# Patient Record
Sex: Female | Born: 1950 | Race: White | Hispanic: No | Marital: Married | Smoking: Never smoker
Health system: Southern US, Community
[De-identification: ages and names within clinical notes are randomized; demographics above are authoritative.]

## PROBLEM LIST (undated history)

## (undated) DIAGNOSIS — T4145XA Adverse effect of unspecified anesthetic, initial encounter: Secondary | ICD-10-CM

## (undated) DIAGNOSIS — M1990 Unspecified osteoarthritis, unspecified site: Secondary | ICD-10-CM

## (undated) DIAGNOSIS — E785 Hyperlipidemia, unspecified: Secondary | ICD-10-CM

## (undated) DIAGNOSIS — I10 Essential (primary) hypertension: Secondary | ICD-10-CM

## (undated) DIAGNOSIS — T8859XA Other complications of anesthesia, initial encounter: Secondary | ICD-10-CM

## (undated) HISTORY — DX: Hyperlipidemia, unspecified: E78.5

## (undated) HISTORY — DX: Unspecified osteoarthritis, unspecified site: M19.90

## (undated) HISTORY — PX: TOTAL KNEE ARTHROPLASTY: SHX125

## (undated) HISTORY — PX: HERNIA REPAIR: SHX51

## (undated) HISTORY — PX: OTHER SURGICAL HISTORY: SHX169

## (undated) HISTORY — DX: Essential (primary) hypertension: I10

---

## 2004-12-25 ENCOUNTER — Encounter: Admission: RE | Admit: 2004-12-25 | Discharge: 2004-12-25 | Payer: Self-pay | Admitting: Orthopedic Surgery

## 2005-06-22 ENCOUNTER — Encounter: Admission: RE | Admit: 2005-06-22 | Discharge: 2005-06-22 | Payer: Self-pay | Admitting: Family Medicine

## 2005-06-29 ENCOUNTER — Encounter: Admission: RE | Admit: 2005-06-29 | Discharge: 2005-06-29 | Payer: Self-pay | Admitting: Family Medicine

## 2007-01-26 ENCOUNTER — Inpatient Hospital Stay (HOSPITAL_COMMUNITY): Admission: RE | Admit: 2007-01-26 | Discharge: 2007-01-31 | Payer: Self-pay | Admitting: Orthopedic Surgery

## 2007-10-17 ENCOUNTER — Inpatient Hospital Stay (HOSPITAL_COMMUNITY): Admission: RE | Admit: 2007-10-17 | Discharge: 2007-10-24 | Payer: Self-pay | Admitting: Orthopedic Surgery

## 2007-11-15 ENCOUNTER — Inpatient Hospital Stay (HOSPITAL_COMMUNITY): Admission: AD | Admit: 2007-11-15 | Discharge: 2007-11-17 | Payer: Self-pay | Admitting: Orthopedic Surgery

## 2007-12-07 ENCOUNTER — Encounter (INDEPENDENT_AMBULATORY_CARE_PROVIDER_SITE_OTHER): Payer: Self-pay | Admitting: Orthopedic Surgery

## 2007-12-07 ENCOUNTER — Ambulatory Visit: Payer: Self-pay | Admitting: Vascular Surgery

## 2007-12-07 ENCOUNTER — Ambulatory Visit (HOSPITAL_COMMUNITY): Admission: RE | Admit: 2007-12-07 | Discharge: 2007-12-07 | Payer: Self-pay | Admitting: Orthopedic Surgery

## 2007-12-30 ENCOUNTER — Inpatient Hospital Stay (HOSPITAL_COMMUNITY): Admission: RE | Admit: 2007-12-30 | Discharge: 2008-01-01 | Payer: Self-pay | Admitting: Orthopedic Surgery

## 2007-12-30 ENCOUNTER — Encounter (INDEPENDENT_AMBULATORY_CARE_PROVIDER_SITE_OTHER): Payer: Self-pay | Admitting: Orthopedic Surgery

## 2008-10-06 ENCOUNTER — Emergency Department (HOSPITAL_COMMUNITY): Admission: EM | Admit: 2008-10-06 | Discharge: 2008-10-07 | Payer: Self-pay | Admitting: Emergency Medicine

## 2009-01-10 ENCOUNTER — Encounter: Admission: RE | Admit: 2009-01-10 | Discharge: 2009-01-10 | Payer: Self-pay | Admitting: Family Medicine

## 2010-02-19 ENCOUNTER — Encounter: Admission: RE | Admit: 2010-02-19 | Discharge: 2010-02-19 | Payer: Self-pay | Admitting: General Surgery

## 2010-11-25 NOTE — Discharge Summary (Signed)
[name], [name]               ACCOUNT NO.:  [id number]   MEDICAL RECORD NO.:  [id number]          PATIENT TYPE:  INP   LOCATION:  1610                         FACILITY:  [facility]   PHYSICIAN:  Almedia Balls. Ranell Patrick, M.D. DATE OF BIRTH:  [birth year]   DATE OF ADMISSION:  01/26/2007  DATE OF DISCHARGE:  01/31/2007                               DISCHARGE SUMMARY   ADMISSION DIAGNOSIS:  Right knee pain secondary to osteoarthritis.   DISCHARGE DIAGNOSIS:  Right knee, status post right total knee  arthroplasty.   BRIEF HISTORY:  The patient is a 60 year old female who has been  complaining of worsening right knee pain that has been refractory to  conservative treatment such as cortisone injections and also physical  therapy.  The patient has elected have a right total knee arthroplasty  by Dr. Malon Kindle.   PROCEDURE:  The patient had a right total knee arthroplasty by Dr.  Malon Kindle on January 26, 2007, noted no complications.  Assistant was  [employer], PA-C.  Spinal anesthesia was used.   HOSPITAL COURSE:  The patient admitted on January 26, 2007, for the above-  stated procedure, which she tolerated well after adequate time in the  post anesthesia care unit, she was transferred up to 6 East.  The  patient was complaining about moderate pain in that right knee on postop  days #1 and 2.  She was able to complete some mild physical therapy and  was only getting up to a chair but states she states she did start  walking on postop day #3 and she is walking quite well by herself with a  walker.  There was a recommendation by occupational therapy and physical  therapy for a possible rehab stint before discharge home but the patient  was very adamant about going home with her daughter's help, and thus we  did give her an extra day over the weekend to work with physical therapy  so she could be strong enough to be discharged home to avoid a skilled  nursing facility stay.  The patient is  being discharged home on January 31, 2007.   The patient has allergies to PENICILLIN.   DISCHARGE MEDICATIONS:  1. Cipro 500 mg one tablet b.i.d. x7 days.  2. Ferrous sulfate 325 mg p.o. daily.  3. Hydrochlorothiazide 25 mg p.o. daily.  4. Lisinopril 20 mg p.o. daily.  5. Robaxin 500 mg p.o. q.6h.  6. Lopressor 100 mg p.o. daily.  7. Coumadin.  8. Percocet 5/325 mg one to two tablets q.4-6h. p.r.n. pain.  9. Restoril 15 mg p.o. q.h.s. p.r.n.   FOLLOW-UP:  The patient will follow back up with Dr. Malon Kindle.  Her  diet is regular.   CONDITION:  Stable.      Thomas B. Durwin Nora, P.A.      Almedia Balls. Ranell Patrick, M.D.  Electronically Signed    TBD/MEDQ  D:  01/31/2007  T:  01/31/2007  Job:  [id number]

## 2010-11-25 NOTE — H&P (Signed)
[name], [name]               ACCOUNT NO.:  [id number]   MEDICAL RECORD NO.:  [id number]          PATIENT TYPE:  INP   LOCATION:  NA                           FACILITY:  [facility]   PHYSICIAN:  Almedia Balls. Ranell Patrick, M.D. DATE OF BIRTH:  1951-02-06   DATE OF ADMISSION:  DATE OF DISCHARGE:                              HISTORY & PHYSICAL   [name] is a 60 year old female with chief complaint of left knee  pain.   HISTORY OF PRESENT ILLNESS:  The patient has been having worsening left  knee pain for over a year that has been refractory to conservative  treatment.  Patient has elected to have a left total knee arthroplasty  by Dr. Malon Kindle.   PAST MEDICAL HISTORY:  Anxiety and hypertension.   FAMILY MEDICAL HISTORY:  Is significant for hypertension, coronary  artery disease.   SOCIAL HISTORY:  The patient does not smoke or use alcohol.  The patient  has a primary medical doctor but did not list it.   DRUG ALLERGIES:  PENICILLIN.   CURRENT MEDICATIONS:  1. Lisinopril/hydrochlorothiazide 20/25 mg 1 tablet p.o. daily.  2. Lasix 20 mg p.o. daily.  3. Metoprolol 100 mg p.o. daily.  4. Alprazolam 0.25 mg p.r.n. t.i.d.  5. Multivitamin daily.  6. Vicodin 5/500 one to two tablets q.4-6h. pain.  7. Calcium 600 mg p.o. daily.   REVIEW OF SYSTEMS:  Negative except for pain with ambulation.   PHYSICAL EXAMINATION:  Pulse 80, respirations 18, blood pressure 118/73.  The patient is a healthy-appearing, 60 year old female in no acute  distress.  Pleasant in affect, oriented x3.  NECK:  Shows full range of motion without any tenderness.  No signs of  spasm.  Cranial nerves II-XII grossly intact.  CHEST:  Active breath sounds bilaterally.  No wheezes, rhonchi, or  rales.  HEART:  Shows regular rate and rhythm.  No murmur.  ABDOMEN:  Nontender, nondistended with active bowel sounds.  EXTREMITIES:  Showed moderate tenderness of the left knee especially to  the medial joint line with  range of motion and crepitus.  SKIN:  Shows no edema.  No rashes.   X-ray showed left knee end-stage osteoarthritis.   IMPRESSION:  Left knee end-stage osteoarthritis.   PLAN OF ACTION:  Is to have a left total knee arthroplasty by Dr. Malon Kindle.      Thomas B. Durwin Nora, P.A.      Almedia Balls. Ranell Patrick, M.D.  Electronically Signed    TBD/MEDQ  D:  10/05/2007  T:  10/06/2007  Job:  [id number]

## 2010-11-25 NOTE — Op Note (Signed)
[name], [name]               ACCOUNT NO.:  [id number]   MEDICAL RECORD NO.:  [id number]          PATIENT TYPE:  AMB   LOCATION:  DAY                          FACILITY:  [facility]   PHYSICIAN:  Almedia Balls. Ranell Patrick, M.D. DATE OF BIRTH:  1950-10-30   DATE OF PROCEDURE:  12/30/2007  DATE OF DISCHARGE:                               OPERATIVE REPORT   PREOPERATIVE DIAGNOSES:  Left leg infection, status post total knee  replacement.   POSTOPERATIVE DIAGNOSES:  Left leg infection, status post total knee  replacement.   PROCEDURE INFORMED:  Left leg irrigation and debridement.   SURGEON:  Almedia Balls. Ranell Patrick, M.D.   ASSISTANT:  None.   ANESTHESIA:  LMA general anesthesia was used.   ESTIMATED BLOOD LOSS:  Minimal.   FLUID REPLACEMENT:  1000 mL of crystalloid.   COUNTS:  Correct.   COMPLICATIONS:  None.   Preoperative antibiotics were given post culture.   INDICATIONS:  The patient is a 56-year female, status post total knee  replacement.  She is now approximately 3 months out from her knee  replacement and has an area at the very distal aspect incision which is  erythematous and swollen.  She has had a prior I&D of the area and  treatment with 4 weeks of IV vancomycin.  The concern is for persistent  soft tissue infection.  The patient does not have clinical signs or  symptoms consistent with an infected total knee replacement.  The  patient presents now for operative I&D.  Informed consent was obtained.   DESCRIPTION OF PROCEDURE:  After an adequate level of anesthesia was  achieved, the patient was positioned supine on the operating room table.  The left leg was sterilely prepped and draped in the usual manner.  The  patient's distal incision was opened up using a 15 blade scalpel.  Dissection was carried sharply down through the subcutaneous tissues.  There was no area of obvious purulence.  There was scar tissue present.  There was some area of softened subcutaneous tissue  which was removed.  The remaining tissue appeared viable and intact.  We went  ahead and pulse irrigated with 3 liters of irrigation.  We then packed  the wound open with a very dilute Betadine solution and some 4x4s and  then a sterile compressive bandage was applied.  The patient had Cubicin  started IV intraoperatively, and we will be following her cultures  closely.      Almedia Balls. Ranell Patrick, M.D.  Electronically Signed     SRN/MEDQ  D:  12/30/2007  T:  12/30/2007  Job:  [id number]

## 2010-11-25 NOTE — H&P (Signed)
[name], [name] NO.:  [id number]   MEDICAL RECORD NO.:  [id number]        PATIENT TYPE:  LINP   LOCATION:                               FACILITY:  [facility]   PHYSICIAN:  Almedia Balls. Ranell Patrick, M.D. DATE OF BIRTH:  [birth year]   DATE OF ADMISSION:  01/26/2007  DATE OF DISCHARGE:                              HISTORY & PHYSICAL   CHIEF COMPLAINT:  Right knee pain.   HISTORY OF PRESENT ILLNESS:  The patient has been having increasing  right knee pain for the past year that has been refractory to  conservative treatment.  The patient would like to have a right total  knee arthroplasty by Dr. Malon Kindle.   PAST MEDICAL HISTORY:  Hypertension.   FAMILY MEDICAL HISTORY:  Cancer and hypertension.   SOCIAL HISTORY:  The patient is divorced.  A patient of Dr. Clovis Riley.  Does not smoke or use alcohol.   ALLERGIES:  PENICILLIN.   CURRENT MEDICATIONS:  1. Metoprolol 100 mg p.o. daily.  2. Lisinopril/hydrochlorothiazide 10/12.5  3. Motrin 800 mg p.o. t.i.d.  4. Calcium 600 mg p.o. daily.   REVIEW OF SYSTEMS:  Negative except for painful ambulation.   PHYSICAL EXAMINATION:  VITAL SIGNS:  Pulse 80, respirations 18, blood  pressure 160/90.  GENERAL:  This is a healthy-appearing 60 year old female in no acute  distress.  Pleasant mood and affect.  Alert and oriented  x3.  NECK:  Shows full range of motion without any tenderness.  CHEST:  Active breath sounds bilaterally with no wheezes, rhonchi, or  rales.  HEART:  Shows regular rate and rhythm.  No murmur.  ABDOMEN:  Nontender, nondistended.  EXTREMITIES:  Show moderate tenderness to the right knee, especially  with range of motion and medial joint line tenderness.  SKIN:  Shows no edema, no rashes.  NEUROLOGIC:  She is intact.   X-ray shows severe osteoarthritis, right knee.   IMPRESSION:  Right knee osteoarthritis, severe.   PLAN:  Right total knee arthroplasty by Dr. Malon Kindle.      Thomas B. Durwin Nora,  P.A.      Almedia Balls. Ranell Patrick, M.D.  Electronically Signed    TBD/MEDQ  D:  01/13/2007  T:  01/13/2007  Job:  [id number]

## 2010-11-25 NOTE — Op Note (Signed)
[name], [name]   MEDICAL RECORD NO.:  [id number]          PATIENT TYPE:  INP   LOCATION:  0011                         FACILITY:  [facility]   PHYSICIAN:  [name], M.D. DATE OF BIRTH:  1950/08/01   DATE OF PROCEDURE:  10/17/2007  DATE OF DISCHARGE:                               OPERATIVE REPORT   PREOPERATIVE DIAGNOSES:  Left knee end-stage osteoarthritis.   POSTOPERATIVE DIAGNOSES:  Left knee end-stage osteoarthritis.   PROCEDURE PERFORMED:  Left total knee replacement using DePuy sigma  rotating platform prosthesis.   SURGEON:  Beverely Low, MD.   ASSISTANT:  Konrad Felix [place], [place].   ANESTHESIA:  General anesthesia was used plus femoral block.   ESTIMATED BLOOD LOSS:  Minimal.   TOURNIQUET TIME:  1 hour and 30 minutes at 350 mmHg.   FLUID REPLACEMENT:  1500 mL crystalloid.   URINE OUTPUT:  200  mL.   INDICATIONS:  The patient is a 60 year old female with a history of  worsening left knee pain.  The patient has known end-stage  osteoarthritis and is status post right total knee replacement and has  done well with that. She presents now with disabling pain and poor  function, desiring total knee replacement to restore function and  eliminate pain.  Informed consent was obtained.   DESCRIPTION OF PROCEDURE:  After an adequate level of anesthesia was  achieved, the patient positioned supine on the operating room table,  left leg was correctly identified, a nonsterile tourniquet was placed on  her left proximal thigh, left leg sterilely prepped and draped in the  usual manner. The patient had no significant flexion contracture on the  table.  After sterile prep and drape and exsanguination of the limb  using Esmarch bandage, we elevated the tourniquet to 350 mmHg. A  longitudinal midline incision was created with the knee in flexion using  a 10 blade scalpel.  Dissection was carried sharply down through  subcutaneous  tissues using a scalpel. A  fresh blade was used to perform  the medial parapatellar arthrotomy.  We everted the patella, divided the  patellofemoral ligaments on the lateral side further gaining exposure.  We then entered the distal femur using a step-cut drill, placing  intramedullary distal femoral resection guide set on 5 degrees left with  10 mm of resection after we resected the distal femur. We sized it to a  size three, performed our anterior, posterior and chamfer cuts with a 4  in 1 block. We then identified the ACL and  PCL, removed all meniscal  tissue, subluxed the tibia anteriorly and performed a tibial cut 6 mm  off the affected side perpendicular to the long axis of the tibia with  minimal posterior slope. We then went ahead and removed all excess  posterior bone off the posterior femur. We then checked our flexion  extension gaps which were balanced and symmetric both in extension and  flexion. We then completed our resection on the tibia and the femur with  a box cut guide on the femur using the tibial preparation instruments to  perform our keel punch. We then placed the trial components and placed a  15 insert in which gave [place] excellent flexion, extension and stability in  full extension with no significant lift off with knee flexion. We then  surfaced the patella with a 35 patella starting at 25 mm thickness going  down to 16 mm thickness and doing this free hand. Once we drilled our  lugs and placed our patella button on, we took the knee through a full  range of motion. Excellent balance was noted with no hand technique. We  then removed all our trial components, plugged the end of the femur with  available bone and thoroughly pulse irrigated and cemented the  components into place. Once the cement was allowed to harden, we removed  excess cement using a 1/4 inch curved osteotome. Trialed both 15 and  17.5 and 17.5 did allow for full extension and gave better balance  and  flexion and better stability and flexion with no hyperextension. We then  took our real 17.5 insert and inserted that into place, placed the drain  deep to the joint and then closed the parapatellar arthrotomy with #1  PDS suture in an interrupted figure-of-eight fashion followed by #0  Vicryl and then 2-0 Vicryl for the subcutaneous closure and 4-0 Monocryl  for skin. Steri-Strips applied followed by a sterile dressing. The  patient tolerated the surgery well.      [name], M.D.  Electronically Signed     SRN/MEDQ  D:  10/17/2007  T:  10/17/2007  Job:  [id number]

## 2010-11-25 NOTE — Op Note (Signed)
NAMEMYKAEL, TROTT               ACCOUNT NO.:  [id number]   MEDICAL RECORD NO.:  [id number]          PATIENT TYPE:  INP   LOCATION:  X001                         FACILITY:  [facility]   PHYSICIAN:  Almedia Balls. Ranell Patrick, M.D. DATE OF BIRTH:  09/09/1950   DATE OF PROCEDURE:  01/26/2007  DATE OF DISCHARGE:                               OPERATIVE REPORT   PREOPERATIVE DIAGNOSIS:  Right knee pain secondary to end-stage  osteoarthritis.   POSTOPERATIVE DIAGNOSIS:  Right knee pain secondary to end-stage  osteoarthritis.   PROCEDURE PERFORMED:  Right total knee replacement using DePuy segmented  rotating platform prosthesis.   ATTENDING SURGEON:  Almedia Balls. Ranell Patrick, M.D.   ASSISTANT:  [place] Charon, [place].   ANESTHESIA:  General anesthesia was used.   ESTIMATED BLOOD LOSS:  Minimal.   TOURNIQUET TIME:  One hour 40 minutes at 350 mmHg.   FLUID REPLACEMENT:  1800 mL of crystalloid.   URINE OUT:  250 mL.   COUNTS:  Instrument count was correct.   COMPLICATIONS:  No complications.   PREOPERATIVE ANTIBIOTICS:  Given.   INDICATIONS:  The patient is a 60 year old female with end-stage  osteoarthritis of the right knee.  The patient has disabling pain and  has failed multiple conservative remedies consisting of activity  modifications, anti-inflammatories and injections.  She has had  arthroscopic debridement as well.  The patient presents now for a total  knee arthroplasty to restore function and eliminate pain.  Informed  consent was obtained.   DESCRIPTION OF PROCEDURE:  After an adequate level of anesthesia was  achieved, she was positioned supine on the operating room table.  A  nonsterile tourniquet placed on the right proximal thigh.  The right leg  was sterilely prepped and draped in the usual manner.  A time-out was  performed, correctly identifying the right leg.  After exsanguinating  the limb using an Esmarch bandage, we elevated the tourniquet to 350  mmHg.  A longitudinal  skin incision was created with the knee in flexion  and dissection carried down through the parapatellar tissues.  We  performed a medial parapatellar arthrotomy, everting the patella,  flexing the knee, entering the distal femur with a step-cut drill  followed by insertion of a distal femoral cutting guide, which was  intramedullary.  We resected 10 mm of distal femur with a 5-degree right  setting.  We then performed our tibial cut, which was done perpendicular  to the long axis of the tibia with a 0-degree posterior slope.  We  resected 6 mm off the affected side.  We then went back to the femur and  we sized the femur to a size 3 and then placed our 4-in-1 cutting block  on the femur; this was a perfect fit.  We then performed anterior,  posterior and chamfer cuts.  Next, we went ahead and completed our  distal femoral resection with the box cut for the size 3 implant for  this posterior cruciate-substituting prosthesis.  I again subluxed the  tibia anteriorly, sized the tibia to a size 3 and then did our keel  punch for the tibia.  I placed our trial components in with a 12.5  insert, noted there to be excellent full extension and nice soft tissue  balance.  We felt that we could probably place a 15 insert and went  ahead and resurfaced the patella with a freehand technique to a 38  patella, starting thickness 26, down to 16 thickness and then we  replaced 9.  At this point, we removed all trial components, removed  excess bone off the posterior femoral condyles and also completed the  soft tissue debridement.  We then fully irrigated the knee with pulse  irrigation, dried the knee and then using DePuy SmartSet high-viscosity  cement, we cemented the components into place; this was a DePuy  segmental rotating platform prosthesis, size 3 tibia, size 3 right femur  with a 38 patella.  Once the cement was allowed to harden, we went ahead  retrialed with a 15; this was noted be excellent  for full extension,  soft tissue balance and range of motion.  We replaced the real 15 insert  in the knee, checked for any excess cement and then closed the knee with  a layered closure.  The parapatellar arthrotomy was closed with  interrupted #1 PDS suture and that was in a figure-of-eight fashion,  followed by 2-0 Vicryl subcutaneous closure and 4-0 Monocryl for the  skin.  Steri-Strips were applied followed by a sterile dressing,  compressive bandage and knee immobilizer.  The patient tolerated surgery  well.      Almedia Balls. Ranell Patrick, M.D.  Electronically Signed     SRN/MEDQ  D:  01/26/2007  T:  01/27/2007  Job:  [id number]

## 2010-11-25 NOTE — Discharge Summary (Signed)
[name], [name] NO.:  [id number]   MEDICAL RECORD NO.:  [id number]          PATIENT TYPE:  INP   LOCATION:  1608                         FACILITY:  [facility]   PHYSICIAN:  Almedia Balls. Ranell Patrick, M.D. DATE OF BIRTH:  04-Oct-1950   DATE OF ADMISSION:  10/17/2007  DATE OF DISCHARGE:                               DISCHARGE SUMMARY   ADMISSION DIAGNOSIS:  Left end-stage knee osteoarthritis.   DISCHARGE DIAGNOSIS:  Left end-stage knee osteoarthritis.   BRIEF HISTORY:  The patient is a 60 year old female with worsening left  knee pain.  The patient has elected to have a left total knee  arthroplasty by Dr. Malon Kindle, when symptoms were refractory to  conservative treatment.   PROCEDURE:  The patient had a left total knee arthroplasty by Dr. Malon Kindle on October 17, 2007.   ASSISTANT:  Standley Dakins PA-C.   ANESTHESIA:  General anesthesia with a femoral block were used.   ESTIMATED BLOOD LOSS:  Minimal.  No complications.   HOSPITAL COURSE:  The patient was admitted on October 17, 2007 for the  above-stated procedure, which she tolerated well.  After adequate time  in the post-anesthesia care unit, she was transferred up to 6 East.  The  patient on post-op day #1, was complaining of moderate pain to that left  knee, was unable to work with physical therapy very much, due to  secondary to her pain.  She had her drain pulled, dressing was kept  intact.  Post-op day #2 she did have a dressing change, was working with  a CPM and did progress slowly with physical therapy, did recommend  skilled nursing facility placement.  We are just awaiting placement for  it.  She did continue to work with physical therapy over the weekend.  On post-op days 3, 4, 5, labs were with acceptable limits.  No signs of  cellulitis or bloody drainage from the wound, and it was healing well.  Neurovascularly, she was intact distally.   DISCHARGE/PLAN:  The patient discharged to a short-term  skilled nursing  facility on October 24, 2007.   FOLLOWUP:  The patient to follow back up with Dr. Malon Kindle in 2  weeks.  Her condition is stable.   DIET:  Is low sodium.   DISCHARGE MEDICATIONS:  1. Lasix 20 mg p.o. q.h.s.  2. Hydrochlorothiazide 25 mg p.o. daily.  3. Lisinopril 20 mg p.o. daily.  4. Robaxin 500 mph q.6 h.  5. Lopressor 100 mg p.o. daily.  6. Coumadin per pharmacy protocol.  7. Xanax 0.25 mg p.o. t.i.d. p.r.n.  8. Percocet 5/3.5, 1-2 tablets q.4-6 hours p.r.n. pain.   ALLERGIES:  The patient has an allergy to penicillin.   ACTIVITY LEVEL:  Weightbearing as tolerated.  Will see the patient back  in 2 weeks.   INR at time of discharge is 1.9.      Thomas B. Durwin Nora, P.A.      Almedia Balls. Ranell Patrick, M.D.  Electronically Signed    TBD/MEDQ  D:  10/23/2007  T:  10/23/2007  Job:  [id number]

## 2010-11-25 NOTE — Op Note (Signed)
[name], [name]               ACCOUNT NO.:  [id number]   MEDICAL RECORD NO.:  [id number]          PATIENT TYPE:  INP   LOCATION:  1617                         FACILITY:  [facility]   PHYSICIAN:  Almedia Balls. Ranell Patrick, M.D. DATE OF BIRTH:  [birth year]   DATE OF PROCEDURE:  11/15/2007  DATE OF DISCHARGE:                               OPERATIVE REPORT   PREOPERATIVE DIAGNOSIS:  Left knee infection.   POSTOPERATIVE DIAGNOSIS:  Left knee infection.   PROCEDURE PERFORMED:  Left leg I&D.   SURGEON:  Almedia Balls. Ranell Patrick, M.D.   ASSISTANT:  None.   General anesthesia was used.   ESTIMATED BLOOD LOSS:  Minimal.   FLUID REPLACED:  500 mL crystalloid.   Instrument count was correct.  No complication.  Post-culture  antibiotics were given, vancomycin.  Cultures x2 were sent  intraoperatively including aerobic and anaerobic cultures x2 from the  wound which was distal portion total knee tissue.   OPERATIVE FINDINGS:  Were minimal cloudy fluid present subcutaneously at  the very distal aspect of the incision and several retained Vicryl  sutures.   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  The patient  had been doing well until about a week ago when she started noticed some  redness around incision, increasing pain, and has not noticed fevers or  chills but presented to orthopedics today for outpatient evaluation and  noted to have crusting around her inferior incision and a little bit of  erythema about 3 or 4 cm.  Based on these findings and concern for  potential for this to get down deeper into her knee replacement, we  discussed with the patient need to bring her to surgery for operative  I&D and cultures and exploration.  The patient understood and agreed.  Informed consent was obtained.   DESCRIPTION OF PROCEDURE:  After adequate level of anesthesia was  achieved, the patient was positioned on the  operative table.  Nonsterile  tourniquet placed on the left proximal thigh.  Left leg sterilely  prepped and draped in usual manner.  We elevated the leg for 3 to 4  minutes and then elevated the tourniquet to 350 mmHg.  Inferior portion  of the patient's prior incision was opened up using a 10 blade scalpel.  Monocryl and Vicryl sutures were identified and removed.  There was  minimal amount of slightly cloudy fluid noted in the subcutaneous  tissues.  Went ahead and culture that x2 with both aerobic, anaerobic  stat Gram stain.  The fascia then had IV vancomycin administered 1 gram  and pulsatile irrigation with sharp debridement of wound including the  wound margins using a  10 blade scalpel and pulse irrigation using the pulse irrigator with  normal saline.  Once we thoroughly pulse irrigated with 4 liters of  sterile saline, we closed the wound with interrupted nylon suture in a  vertical mattress full-thickness fashion followed by application of  sterile compressive bandage.  The patient tolerated the procedure well.  Almedia Balls. Ranell Patrick, M.D.  Electronically Signed     SRN/MEDQ  D:  11/15/2007  T:  11/15/2007  Job:  [id number]

## 2010-11-28 NOTE — Discharge Summary (Signed)
NAMEJAILIN, [name] NO.:  [id number]   MEDICAL RECORD NO.:  [id number]          PATIENT TYPE:  INP   LOCATION:  1617                         FACILITY:  [facility]   PHYSICIAN:  Almedia Balls. Ranell Patrick, M.D. DATE OF BIRTH:  Jun 06, 1951   DATE OF ADMISSION:  12/30/2007  DATE OF DISCHARGE:  01/01/2008                               DISCHARGE SUMMARY   ADMITTING DIAGNOSIS:  Superficial infection following total knee  replacement left leg.   DISCHARGE DIAGNOSIS:  Superficial infection following total knee  replacement left leg.   PROCEDURE PERFORMED:  Left leg I and D on December 30, 2007.   CONSULTING SERVICES:  Discharge planning.   HISTORY OF PRESENT ILLNESS:  The patient is a 60 year old female status  post total knee replacement back in April 2009.  The patient  postoperatively developed an MRSA infection which was not deep in the  joint.  This was felt to be superficial.  The patient was treated with  surgical I and D and IV antibiotics.  Following subsequent home IV  antibiotic course, the patient did improve clinically; however, over the  last week has developed redness consistent with potential superficial  infection presents now for operative treatment and also switching her  antibiotics.  For further details and the patient's past medical history  or physical examination, please see the medical record.   HOSPITAL COURSE:  The patient admitted to orthopedics and taken to  surgery for I and D for wound.  Following a local excisional I and D, we  went and packed the wound with a normal saline moist to dry dressing.  Dressing changes were started on the floor.  The patient remained  afebrile.  We went ahead and started the patient on oral doxycycline  which was the bacteria that she had from her prior admission and was  sensitive to.  She was arranged for discharge on p.o. doxycycline pain  medication, recommended to elevate her leg as much as possible and  follow up in  orthopedics in 2 days.      Almedia Balls. Ranell Patrick, M.D.  Electronically Signed     SRN/MEDQ  D:  01/29/2008  T:  01/29/2008  Job:  [id number]

## 2011-03-02 ENCOUNTER — Inpatient Hospital Stay (HOSPITAL_COMMUNITY)
Admission: EM | Admit: 2011-03-02 | Discharge: 2011-03-09 | DRG: 151 | Disposition: A | Discharge status: Home Health | Payer: BC Managed Care – PPO | Attending: Surgery | Admitting: Surgery

## 2011-03-02 ENCOUNTER — Emergency Department (HOSPITAL_COMMUNITY): Payer: BC Managed Care – PPO

## 2011-03-02 DIAGNOSIS — R112 Nausea with vomiting, unspecified: Secondary | ICD-10-CM

## 2011-03-02 DIAGNOSIS — Z8614 Personal history of Methicillin resistant Staphylococcus aureus infection: Secondary | ICD-10-CM

## 2011-03-02 DIAGNOSIS — F341 Dysthymic disorder: Secondary | ICD-10-CM | POA: Diagnosis present

## 2011-03-02 DIAGNOSIS — E785 Hyperlipidemia, unspecified: Secondary | ICD-10-CM | POA: Diagnosis present

## 2011-03-02 DIAGNOSIS — Z96659 Presence of unspecified artificial knee joint: Secondary | ICD-10-CM

## 2011-03-02 DIAGNOSIS — E119 Type 2 diabetes mellitus without complications: Secondary | ICD-10-CM | POA: Diagnosis present

## 2011-03-02 DIAGNOSIS — I498 Other specified cardiac arrhythmias: Secondary | ICD-10-CM | POA: Diagnosis present

## 2011-03-02 DIAGNOSIS — R5082 Postprocedural fever: Secondary | ICD-10-CM | POA: Diagnosis not present

## 2011-03-02 DIAGNOSIS — K436 Other and unspecified ventral hernia with obstruction, without gangrene: Principal | ICD-10-CM | POA: Diagnosis present

## 2011-03-02 DIAGNOSIS — K430 Incisional hernia with obstruction, without gangrene: Secondary | ICD-10-CM

## 2011-03-02 DIAGNOSIS — K660 Peritoneal adhesions (postprocedural) (postinfection): Secondary | ICD-10-CM | POA: Diagnosis present

## 2011-03-02 DIAGNOSIS — Z885 Allergy status to narcotic agent status: Secondary | ICD-10-CM

## 2011-03-02 DIAGNOSIS — K56609 Unspecified intestinal obstruction, unspecified as to partial versus complete obstruction: Secondary | ICD-10-CM

## 2011-03-02 DIAGNOSIS — I10 Essential (primary) hypertension: Secondary | ICD-10-CM

## 2011-03-02 DIAGNOSIS — Z6841 Body Mass Index (BMI) 40.0 and over, adult: Secondary | ICD-10-CM

## 2011-03-02 DIAGNOSIS — M109 Gout, unspecified: Secondary | ICD-10-CM | POA: Diagnosis present

## 2011-03-02 DIAGNOSIS — Z79899 Other long term (current) drug therapy: Secondary | ICD-10-CM

## 2011-03-02 LAB — URINALYSIS, ROUTINE W REFLEX MICROSCOPIC
Hgb urine dipstick: NEGATIVE
Leukocytes, UA: NEGATIVE
Specific Gravity, Urine: 1.023 (ref 1.005–1.030)
Urobilinogen, UA: 0.2 mg/dL (ref 0.0–1.0)

## 2011-03-02 LAB — CBC
MCH: 32.4 pg (ref 26.0–34.0)
MCHC: 32.5 g/dL (ref 30.0–36.0)
Platelets: 277 10*3/uL (ref 150–400)

## 2011-03-02 LAB — DIFFERENTIAL
Basophils Relative: 1 % (ref 0–1)
Eosinophils Absolute: 0.3 10*3/uL (ref 0.0–0.7)
Eosinophils Relative: 3 % (ref 0–5)
Monocytes Absolute: 0.3 10*3/uL (ref 0.1–1.0)
Monocytes Relative: 4 % (ref 3–12)

## 2011-03-02 LAB — COMPREHENSIVE METABOLIC PANEL
ALT: 24 U/L (ref 0–35)
AST: 27 U/L (ref 0–37)
Calcium: 10.5 mg/dL (ref 8.4–10.5)
GFR calc Af Amer: >60 mL/min (ref >60)
Glucose, Bld: 146 mg/dL — ABNORMAL HIGH (ref 70–99)
Sodium: 139 mEq/L (ref 135–145)
Total Protein: 7.3 g/dL (ref 6.0–8.3)

## 2011-03-02 MED ORDER — IOHEXOL 300 MG/ML  SOLN
125.0000 mL | Freq: Once | INTRAMUSCULAR | Status: AC | PRN
  Administered 2011-03-02: 125 mL via INTRAVENOUS

## 2011-03-03 HISTORY — PX: HERNIA REPAIR: SHX51

## 2011-03-03 LAB — CBC
MCV: 101 fL — ABNORMAL HIGH (ref 78.0–100.0)
Platelets: 244 10*3/uL (ref 150–400)
RBC: 3.96 MIL/uL (ref 3.87–5.11)
WBC: 7.4 10*3/uL (ref 4.0–10.5)

## 2011-03-03 LAB — BASIC METABOLIC PANEL
CO2: 27 mEq/L (ref 19–32)
Chloride: 104 mEq/L (ref 96–112)
Creatinine, Ser: 0.85 mg/dL (ref 0.50–1.10)
GFR calc Af Amer: >60 mL/min (ref >60)
Potassium: 3.8 mEq/L (ref 3.5–5.1)
Sodium: 137 mEq/L (ref 135–145)

## 2011-03-03 LAB — MAGNESIUM: Magnesium: 1.9 mg/dL (ref 1.5–2.5)

## 2011-03-03 LAB — MRSA PCR SCREENING: MRSA by PCR: POSITIVE — AB

## 2011-03-03 LAB — PHOSPHORUS: Phosphorus: 3.5 mg/dL (ref 2.3–4.6)

## 2011-03-04 LAB — BASIC METABOLIC PANEL
CO2: 23 mEq/L (ref 19–32)
Chloride: 104 mEq/L (ref 96–112)
GFR calc Af Amer: >60 mL/min (ref >60)
Potassium: 3.8 mEq/L (ref 3.5–5.1)
Sodium: 139 mEq/L (ref 135–145)

## 2011-03-04 LAB — CBC
Platelets: 297 10*3/uL (ref 150–400)
RBC: 3.99 MIL/uL (ref 3.87–5.11)
RDW: 14 % (ref 11.5–15.5)
WBC: 16.2 10*3/uL — ABNORMAL HIGH (ref 4.0–10.5)

## 2011-03-05 LAB — BASIC METABOLIC PANEL
BUN: 20 mg/dL (ref 6–23)
Calcium: 8.7 mg/dL (ref 8.4–10.5)
Creatinine, Ser: 0.89 mg/dL (ref 0.50–1.10)
GFR calc Af Amer: >60 mL/min (ref >60)
GFR calc non Af Amer: >60 mL/min (ref >60)

## 2011-03-05 LAB — URINE MICROSCOPIC-ADD ON

## 2011-03-05 LAB — URINALYSIS, ROUTINE W REFLEX MICROSCOPIC
Ketones, ur: 15 mg/dL — AB
Nitrite: NEGATIVE
Specific Gravity, Urine: 1.021 (ref 1.005–1.030)
Urobilinogen, UA: 2 mg/dL — ABNORMAL HIGH (ref 0.0–1.0)

## 2011-03-05 LAB — CBC
HCT: 36.8 % (ref 36.0–46.0)
MCH: 32.1 pg (ref 26.0–34.0)
MCHC: 32.1 g/dL (ref 30.0–36.0)
MCV: 100 fL (ref 78.0–100.0)
Platelets: 216 10*3/uL (ref 150–400)
RDW: 13.8 % (ref 11.5–15.5)
WBC: 11.1 10*3/uL — ABNORMAL HIGH (ref 4.0–10.5)

## 2011-03-05 LAB — MAGNESIUM: Magnesium: 1.9 mg/dL (ref 1.5–2.5)

## 2011-03-05 NOTE — Op Note (Signed)
NAMEMARIVEL, [name] NO.:  [id number]  MEDICAL RECORD NO.:  [id number]  LOCATION:  1535                         FACILITY:  [facility]  PHYSICIAN:  Velora Heckler, MD      DATE OF BIRTH:  June 16, 1951  DATE OF PROCEDURE:  03/03/2011                               OPERATIVE REPORT   PREOPERATIVE DIAGNOSIS:  Incarcerated ventral hernia with partial small- bowel obstruction.  POSTOPERATIVE DIAGNOSIS:  Incarcerated ventral hernia with partial small- bowel obstruction.  PROCEDURE:  Repair of incarcerated ventral hernia with mesh, limited lysis of adhesions.  SURGEON:  Velora Heckler, M.D., FACS  ASSISTANT:  Eber Hong, PA-C  ANESTHESIA:  General.  ESTIMATED BLOOD LOSS:  Minimal.  PREPARATION:  ChloraPrep.  COMPLICATIONS:  None.  INDICATIONS:  The patient is a 60 year old white female who presented to the emergency department with 2-day history of abdominal pain.  Workup included a CT scan of the abdomen and pelvis showing multiple abdominal ventral hernias containing colon and small bowel with partial small- bowel obstruction.  The patient was admitted to the general surgery service.  She was prepared overnight and brought to the operating room for exploration and repair of incarcerated ventral hernia with small- bowel obstruction.  BODY OF REPORT:  Procedure was done in OR #6 at the [facility].  The patient was brought to the operating room, placed in supine position on the operating room table.  Following administration of general anesthesia, the patient was positioned and then prepped and draped in the usual strict aseptic fashion.  After ascertaining that an adequate level of anesthesia been achieved, a midline abdominal incision was made with a #10 blade.  Dissection was carried into the subcutaneous tissues.  There are at least 5 discrete hernia defects with associated hernia sacs in the subcutaneous tissues. These were  dissected out.  The largest hernia sac contains incarcerated colon.  The next largest hernia sac contains incarcerated small bowel. All of the hernia sacs are dissected out down to the level of the abdominal wall.  Hernia sacs were excised in their entirety and the contents were freed completely.  Colon was reduced back within the peritoneal cavity.  Small bowel required some sharp lysis of adhesions in order to assure that the bowel did not have a point of obstruction and was easily reducible back within the peritoneal cavity.  Smaller fascial defects contained incarcerated preperitoneal fat and omentum. These were also reduced.  The largest fascial defect measures approximately 8 cm in diameter.  The second largest defect measures approximately 5 cm in diameter.  Three additional defects measure approximately 2 cm in diameter.  All 5 defects were closed with interrupted #1 Novofil and 0 Novofil simple sutures.  Next, the abdominal wall was reinforced with a sheet of polypropylene mesh.  A 10 inch x 14 inch sheet of mesh was trimmed slightly to the appropriate dimensions.  It was placed as an onlay over the anterior abdominal wall and then secured to the abdominal wall circumferentially with interrupted 0 Novofil simple sutures.  A 19-French Blake drains were brought in from inferior stab wounds and placed across the mesh. Cavity was irrigated  with warm saline and good hemostasis was noted. Subcutaneous tissues were closed with interrupted 2-0 Vicryl sutures. Skin was closed with stainless steel staples.  Drains were placed to bulb suction.  Sterile dressings were applied.  Abdominal binder was applied.  The patient was brought to the recovery room in stable condition.  The patient tolerated the procedure well.   Velora Heckler, MD, FACS     TMG/MEDQ  D:  03/03/2011  T:  03/03/2011  Job:  [id number]  cc:   Juanetta Gosling, MD [address] [place]  [address]  [facility]  Electronically Signed by Darnell Level MD on 03/05/2011 02:17:14 PM

## 2011-03-06 LAB — URINE CULTURE
Colony Count: NO GROWTH
Culture: NO GROWTH
Special Requests: NEGATIVE

## 2011-03-07 LAB — CBC
Hemoglobin: 10.3 g/dL — ABNORMAL LOW (ref 12.0–15.0)
MCH: 31.6 pg (ref 26.0–34.0)
MCHC: 31.6 g/dL (ref 30.0–36.0)
MCV: 100 fL (ref 78.0–100.0)
Platelets: 238 10*3/uL (ref 150–400)
RBC: 3.26 MIL/uL — ABNORMAL LOW (ref 3.87–5.11)

## 2011-03-07 LAB — BASIC METABOLIC PANEL
CO2: 29 mEq/L (ref 19–32)
Calcium: 8.9 mg/dL (ref 8.4–10.5)
Creatinine, Ser: 0.82 mg/dL (ref 0.50–1.10)
GFR calc non Af Amer: >60 mL/min (ref >60)
Glucose, Bld: 112 mg/dL — ABNORMAL HIGH (ref 70–99)

## 2011-03-12 ENCOUNTER — Telehealth (INDEPENDENT_AMBULATORY_CARE_PROVIDER_SITE_OTHER): Payer: Self-pay

## 2011-03-12 NOTE — Telephone Encounter (Signed)
Home health PT (Debbie)  called to report patient's bp has been elevated 150 to 160 systolic/ 88-90 diastolic, some dizziness. Taking blood pressure meds- Advised  therapist to fax over summary notes. Therapist will be seeking to continue  PT visits  for two weeks. Also advised therapist to call patients doctor that handles blood pressure medicine. --- per Dr. Gerrit Friends ok to continue pt 2 more weeks- call primary for blood pressure.--- Left above message on Debbie's voicemail [phone].

## 2011-03-17 ENCOUNTER — Encounter (INDEPENDENT_AMBULATORY_CARE_PROVIDER_SITE_OTHER): Payer: Self-pay | Admitting: Surgery

## 2011-03-17 ENCOUNTER — Other Ambulatory Visit (INDEPENDENT_AMBULATORY_CARE_PROVIDER_SITE_OTHER): Payer: Self-pay | Admitting: Surgery

## 2011-03-17 ENCOUNTER — Ambulatory Visit (INDEPENDENT_AMBULATORY_CARE_PROVIDER_SITE_OTHER): Payer: BC Managed Care – PPO | Admitting: Surgery

## 2011-03-17 DIAGNOSIS — K436 Other and unspecified ventral hernia with obstruction, without gangrene: Secondary | ICD-10-CM | POA: Insufficient documentation

## 2011-03-17 DIAGNOSIS — S31109A Unspecified open wound of abdominal wall, unspecified quadrant without penetration into peritoneal cavity, initial encounter: Secondary | ICD-10-CM

## 2011-03-17 MED ORDER — DOXYCYCLINE HYCLATE 100 MG PO TABS: 100.0000 mg | ORAL_TABLET | Freq: Two times a day (BID) | ORAL | Status: DC

## 2011-03-17 NOTE — Progress Notes (Signed)
Visit Diagnoses: 1. Ventral hernia with obstruction     HISTORY: Patient returns for followup having undergone urgent repair of incarcerated ventral hernia with bowel obstruction. Patient did have a large onlay mesh of polypropylene. She has 2 drains in place. Over the past 24 hours she has noted a change in the character of the right abdominal drain. This has become tan in color.   EXAM: Midline abdominal incision appears to be healing well. One half of the staples are removed. No drainage from the incision. The skin of the anterior abdominal is normal with no signs of cellulitis. Left side abdominal drain is serous. Right-sided abdominal drain is purulent. This drain is removed and a dry gauze dressing is placed over the site.  Fluid from the drain is placed on a culture swab and submitted to the laboratory for evaluation.   IMPRESSION: #1 status post incarcerated ventral hernia repair with mesh, urgent due to small bowel obstruction #2 possible postoperative wound infection, cultures pending   PLAN: Patient will start on doxycycline twice daily. The left-sided abdominal drain will be left in place and will be monitored closely. Patient will return in one week for wound check and reevaluation.   Velora Heckler, MD, FACS General & Endocrine Surgery [facility]

## 2011-03-17 NOTE — Progress Notes (Signed)
Addended by: Charise Carwin on: 03/17/2011 04:13 PM   Modules accepted: Orders

## 2011-03-17 NOTE — Patient Instructions (Signed)
Continue drain care as instructed.  Wash wound with soap and water daily.  Start antibiotic today.

## 2011-03-19 NOTE — H&P (Signed)
NAMEANNTOINETTE, [name] NO.:  [id number]  MEDICAL RECORD NO.:  [id number]  LOCATION:  1535                         FACILITY:  [facility]  PHYSICIAN:  Velora Heckler, MD      DATE OF BIRTH:  May 09, 1951  DATE OF ADMISSION:  03/02/2011 DATE OF DISCHARGE:                             HISTORY & PHYSICAL   CHIEF COMPLAINT:  Abdominal pain, nausea, and vomiting.  HISTORY OF PRESENT ILLNESS:  [name] is a 60 year old mildly obese female who has a known history of ventral hernia.  She has never had an issue in the past and tried to avoid surgery.  However, over the past 24 hours, she has had an insignificant increase in the pain as well as associated nausea and vomiting.  She denies any fever and actually had a normal bowel movement yesterday.  She denies associated symptoms of any shortness of breath, cough, dysuria, or hematuria.  She denies any vomiting, diarrhea, or constipation.  She denies any abnormal food ingestion or travel.  She presented to the emergency department for a workup and the CT scan showed evidence of dilated small-bowel loops proximal to the ventral hernia with decompressed loops, seen exiting the hernia concerning for bowel obstruction within incarcerated hernia.  We have been asked to evaluate the patient for surgical intervention.  PAST MEDICAL HISTORY:  Hypertension, gout, hyperlipidemia and depression/anxiety disorder.  PAST SURGICAL HISTORY:  Cholecystectomy, C-section, she had a bilateral total knee replacement most recently in 2005, complicated on the left side by MRSA infection.  FAMILY HISTORY:  Noncontributory in the present case.  SOCIAL HISTORY:  The patient denies any tobacco, alcohol, or illicit drug use.  She works in [employer], spending many hours a day on her feet and having to do frequent repetitive lifting.  ALLERGIES:  Include, 1. Penicillin. 2. Codeine.  MEDICATIONS:  Include, 1.  Allopurinol 300 mg once daily. 2. Lisinopril/hydrochlorothiazide 20/25 once daily. 3. Percocet 5/325, 1 tablet daily needed. 4. Metoprolol 100 mg once daily. 5. Lasix 20 mg once daily as needed. 6. Simvastatin 40 mg daily. 7. Wellbutrin 300 mg daily. 8. Xanax 0.25 mg daily as needed.  REVIEW OF SYSTEMS:  Please see history of present illness for pertinent findings, otherwise complete 12-system review found  negative.  PHYSICAL EXAMINATION:  GENERAL:  Well-developed, obese, 60 year old female who is in mild distress. VITAL SIGNS:  Show a temperature of 97.8, heart rate of 78, respiratory rate of 22,  blood pressure of 151/83. ENT:  Unremarkable. NECK:  Supple without lymphadenopathy.  Trachea is midline.  No thyromegaly. LUNGS:  Clear to auscultation.  No wheezes, rhonchi, or rales.  Normal respiratory effort without use of accessory muscles. HEART:  Regular rate and rhythm.  No murmurs, gallops, or rubs. Carotids 2+ brisk without bruits.  Peripheral pulses intact and symmetrical. ABDOMEN:  Obese.  The patient has a large palpably tender ventral hernia near the umbilicus.  This is nonreducible and is again quite tender. There is no overlying skin changes, such as erythema or ischemia.  Bowel sounds are hypoactive.  No organomegaly or additional hernias are appreciated. RECTAL:  Deferred. GENITOURINARY:  Deferred. EXTREMITIES:  Limited  range of motion of all extremities due to narcotic administration. NEUROLOGIC:  The patient is alert and oriented x3.  DIAGNOSTICS:  CBC today shows a white blood cell count of 8.2, hemoglobin of 13.2, hematocrit of 40.6, and platelet count of 277. Metabolic panel shows a sodium of 139, potassium of 3.9, chloride of 109, CO2 of __________ creatinine of 0.9, glucose of 146.  Liver enzymes within normal limits.  Urinalysis negative.  CT scan again shows a complex lower abdominal hernia containing loops of large small-bowel, there are actually  2  separate hernia sacs.  The left- sided hernia sac contained a normal caliber colon containing both gas and stool present proximal and distal to the hernia.  The right-sided hernia sac contained dilated loops of small bowel with decompressed small-bowel thin exudates.  IMPRESSION: 1. Incarcerated complex ventral hernia with partial small bowel     obstruction. 2. Hypertension. 3. Morbid obesity.  PLAN:  We will admit the patient and place an NG tube, depress the proximal small bowel of the stomach, keep her n.p.o. in preparation for reduction and repair of this ventral hernia.  The patient was seen and examined by Dr. Darnell Level.     Brayton El, PA-C   ______________________________ Velora Heckler, MD    KB/MEDQ  D:  03/02/2011  T:  03/03/2011  Job:  [id number]  cc:   Bryan Lemma. Manus Gunning, M.D. Fax: [phone]  Electronically Signed by Brayton El  on 03/09/2011 12:02:38 PM Electronically Signed by Darnell Level MD on 03/19/2011 11:27:58 AM

## 2011-03-19 NOTE — Discharge Summary (Signed)
NAMEOLIVYA, SOBOL NO.:  [id number]  MEDICAL RECORD NO.:  [id number]  LOCATION:  1535                         FACILITY:  [facility]  PHYSICIAN:  Mary Sella. Andrey Campanile, MD     DATE OF BIRTH:  1950/09/27  DATE OF ADMISSION:  03/02/2011 DATE OF DISCHARGE:  03/09/2011                              DISCHARGE SUMMARY   ADMITTING PHYSICIAN:  Dr. Darnell Level.  DISCHARGING PHYSICIAN:  Dr. Gaynelle Adu.  CONSULTANTS:  None.  PROCEDURES:  Repair of incarcerated ventral hernia with mesh and limited lysis of adhesions by Dr. Darnell Level on March 03, 2011.  REASON FOR ADMISSION:  Ms. Ausburn is a 60 year old morbidly obese female with a known history of ventral hernia.  She has been apparently tried to avoid surgery; however, over the last 24 hours, she had a significant increase in pain as well as associated nausea and vomiting.  She presented to the emergency department.  She had a CT scan which revealed evidence of dilated small bowel loops proximal to the ventral hernia with decompressed loop distal.  These were seen extruding the hernia concerning for bowel obstruction from an incarcerated hernia.  Please see admitting history and physical for further details.  ADMITTING DIAGNOSES: 1. Incarcerated complex ventral hernia with partial small bowel     obstruction. 2. Hypertension. 3. Morbid obesity. 4. Depression and anxiety. 5. Gout. 6. Hyperlipidemia.  Plan, at this time, the patient was admitted.  The following day she was taken to the operating room, where she underwent an open repair of incarcerated ventral hernia.  She had the lysis of adhesions at the same time.  She also had mesh placed.  She had two JP drains placed. Postoperatively, the patient did quite well.  She did have an NG tube placed.  On postoperative day #1, the patient was not having any flatus. Her NG tube was kept in place.  On postoperative day 2, the patient did have some active bowel sounds;  however, she had not had flatus.  She had minimal NG tube output and therefore was discontinued at this time as she was placed on sips of clear liquids.  Over the next several days her diet was advanced as tolerated.  She was able to get up and mobilize fairly well.  She did have physical therapy evaluate her who felt that she would benefit from some home health physical therapy.  By postoperative day #6, the patient was tolerating regular diet.  Her JP drains were mostly serous output.  Her incision was clean, dry, and intact with staples present.  She was otherwise felt stable for discharge home.  DISCHARGE DIAGNOSES: 1. Incarcerated ventral hernia with partial small bowel obstruction,     status post open ventral hernia repair with mesh and lysis of     adhesions. 2. Hypotension. 3. Hyperlipidemia. 4. Gout. 5. Depression/anxiety. 6. Morbid obesity.  DISCHARGE MEDICATIONS:  The patient may resume home medications including allopurinol, black cohosh, furosemide, lisinopril/hydrochlorothiazide, metoprolol succinate, multivitamins, simvastatin, Wellbutrin, and Xanax.  She was given a new prescriptions for hydrocodone/acetaminophen 7.5-325 mg 1 to 2 q.4 hours p.r.n. pain.  DISCHARGE INSTRUCTIONS:  The patient may increase her activity slowly  and walk up steps.  She may shower, however, she is not to bath.  She is not do any heavy lifting for the next 6 weeks.  She is not to drive for the next 1-2 weeks.  She has no dietary restrictions.  She is to place dry gauze around her JP drains.  She is to empty both JP drains and record her output daily.  She is to follow up with Dr. Darnell Level in our office next week, September 4th,  at 3:15 p.m. for staple removal and JP drain removal.     Letha Cape, PA   ______________________________ Mary Sella. Andrey Campanile, MD    KEO/MEDQ  D:  03/09/2011  T:  03/09/2011  Job:  [id number]  cc:   Velora Heckler, MD [address] [place]  [address]  Electronically Signed by Barnetta Chapel PA on 03/18/2011 11:44:10 AM Electronically Signed by Gaynelle Adu M.D. on 03/19/2011 02:09:43 PM

## 2011-03-20 LAB — WOUND CULTURE

## 2011-03-23 ENCOUNTER — Ambulatory Visit (INDEPENDENT_AMBULATORY_CARE_PROVIDER_SITE_OTHER): Payer: BC Managed Care – PPO | Admitting: Surgery

## 2011-03-23 ENCOUNTER — Encounter (INDEPENDENT_AMBULATORY_CARE_PROVIDER_SITE_OTHER): Payer: Self-pay | Admitting: Surgery

## 2011-03-23 ENCOUNTER — Other Ambulatory Visit (INDEPENDENT_AMBULATORY_CARE_PROVIDER_SITE_OTHER): Payer: Self-pay | Admitting: Surgery

## 2011-03-23 VITALS — BP 144/90 | Temp 96.8°F | Ht 68.0 in | Wt 318.2 lb

## 2011-03-23 DIAGNOSIS — B957 Other staphylococcus as the cause of diseases classified elsewhere: Secondary | ICD-10-CM | POA: Insufficient documentation

## 2011-03-23 MED ORDER — DOXYCYCLINE HYCLATE 100 MG PO TABS: 100.0000 mg | ORAL_TABLET | Freq: Two times a day (BID) | ORAL | Status: AC

## 2011-03-23 NOTE — Progress Notes (Signed)
Visit Diagnoses: 1. Staphylococcus aureus infection, multiple-resistant (MRSA)     HISTORY: Patient returns for followup. She underwent extensive repair on an urgent basis of multiple incarcerated ventral hernia.  At her last office visit there was persistent drainage from the right-sided drained with purulent fluid. A sample was sent to the laboratory and demonstrated methicillin-resistant staph aureus infection. Patient is taking doxycycline twice daily. She returns today for wound check and discussion of further management.   EXAM: Midline wound is well-healed and remaining staples are removed. Left-sided drain has serous output approximately 20 cc daily. There is mild tenderness over the right side of the abdominal wall but there is no sign of cellulitis.   IMPRESSION: #1 status post repair of multiple ventral hernia with incarceration and partial small bowel obstruction #2 personal history of methicillin staph infection in the knee #3 suspected methicillin resistant staph infection of abdominal wound with mesh #4 morbid obesity   PLAN: Remaining staples were removed from the wound today. Left-sided drain is left in place. Patient will continue taking doxycycline 100 mg twice daily. I have reviewed her prescription for an additional 10 days. We will obtain a CBC with differential. Patient will return in one week.  I have discussed with the patient the fact that the wound is unlikely to heal long-term given the methicillin resistant staph infection. I suspect that she will require exploration of the wound with removal of the mesh and placement of a vacuum dressing. This may lead to recurrent herniation both due to the infection and to the removal of the mesh. This is likely to be able to be process and require a repeat hospitalization. Patient and her family understand.   Velora Heckler, MD, FACS General & Endocrine Surgery [facility]

## 2011-03-24 LAB — CBC WITH DIFFERENTIAL/PLATELET
Basophils Relative: 1 % (ref 0–1)
HCT: 41.9 % (ref 36.0–46.0)
Hemoglobin: 13.1 g/dL (ref 12.0–15.0)
Lymphocytes Relative: 18 % (ref 12–46)
Lymphs Abs: 1.4 10*3/uL (ref 0.7–4.0)
Monocytes Relative: 8 % (ref 3–12)
Neutro Abs: 4.5 10*3/uL (ref 1.7–7.7)
Neutrophils Relative %: 62 % (ref 43–77)
RBC: 4.18 MIL/uL (ref 3.87–5.11)

## 2011-03-30 ENCOUNTER — Encounter (INDEPENDENT_AMBULATORY_CARE_PROVIDER_SITE_OTHER): Payer: Self-pay | Admitting: Surgery

## 2011-03-31 ENCOUNTER — Encounter (INDEPENDENT_AMBULATORY_CARE_PROVIDER_SITE_OTHER): Payer: Self-pay | Admitting: Surgery

## 2011-03-31 ENCOUNTER — Ambulatory Visit (INDEPENDENT_AMBULATORY_CARE_PROVIDER_SITE_OTHER): Payer: BC Managed Care – PPO | Admitting: Surgery

## 2011-03-31 DIAGNOSIS — B957 Other staphylococcus as the cause of diseases classified elsewhere: Secondary | ICD-10-CM

## 2011-03-31 DIAGNOSIS — K436 Other and unspecified ventral hernia with obstruction, without gangrene: Secondary | ICD-10-CM

## 2011-03-31 NOTE — Patient Instructions (Signed)
  COCOA BUTTER & VITAMIN E CREAM  ([employer] or other brand)  Apply cocoa butter/vitamin E cream to your incision 2 - 3 times daily.  Massage cream into incision for one minute with each application.  Use sunscreen (50 SPF or higher) for first 6 months after surgery.  You may substitute Mederma or other scar reducing creams as desired.   

## 2011-03-31 NOTE — Progress Notes (Signed)
Visit Diagnoses: 1. Staphylococcus aureus infection, multiple-resistant (MRSA)   2. Ventral hernia with obstruction     HISTORY: Patient returns for scheduled wound check. She has completed 10 days of doxycycline. She will pick up another prescription today for an additional 10 days of doxycycline.   EXAM: Midline incision has healed nicely. No sign of drainage. No erythema. No cellulitis. Left remaining drain has only serous fluid and is removed today. Mild tenderness.   IMPRESSION: Status post ventral hernia repair with mesh, history of MRSA infection, positive culture   PLAN: The patient remains on doxycycline. Overall her wound healing appears to be good. However I am concerned that there will be a chronic infection involving the mesh requiring repeat operation and removal of the mesh. I have again informed the patient of the high probability of this outcome. She understands. We will continue to follow her closely.   Velora Heckler, MD, FACS General & Endocrine Surgery [facility]

## 2011-04-06 LAB — DIFFERENTIAL
Basophils Relative: 1
Monocytes Absolute: 0.6
Monocytes Relative: 7
Neutro Abs: 5.9

## 2011-04-06 LAB — APTT: aPTT: 22 — ABNORMAL LOW

## 2011-04-06 LAB — CBC
HCT: 38.7
Hemoglobin: 13.4
MCHC: 34.6
RBC: 4.09

## 2011-04-06 LAB — BASIC METABOLIC PANEL
CO2: 30
Calcium: 9.6
Chloride: 106
GFR calc Af Amer: >60
Potassium: 4.1
Sodium: 143

## 2011-04-06 LAB — URINALYSIS, ROUTINE W REFLEX MICROSCOPIC
Bilirubin Urine: NEGATIVE
Nitrite: NEGATIVE
Specific Gravity, Urine: 1.016
pH: 6.5

## 2011-04-07 LAB — CBC
HCT: 28.1 — ABNORMAL LOW
HCT: 29.6 — ABNORMAL LOW
Hemoglobin: 10.4 — ABNORMAL LOW
Hemoglobin: 10.9 — ABNORMAL LOW
MCHC: 34.6
MCV: 94.3
Platelets: 185
RBC: 2.98 — ABNORMAL LOW
RBC: 3.13 — ABNORMAL LOW
RBC: 3.31 — ABNORMAL LOW
WBC: 8
WBC: 8.8

## 2011-04-07 LAB — PROTIME-INR
INR: 1.1
INR: 1.2
INR: 1.4
INR: 1.7 — ABNORMAL HIGH
INR: 2.3 — ABNORMAL HIGH
Prothrombin Time: 14.2
Prothrombin Time: 16 — ABNORMAL HIGH
Prothrombin Time: 17 — ABNORMAL HIGH
Prothrombin Time: 20 — ABNORMAL HIGH
Prothrombin Time: 22.6 — ABNORMAL HIGH

## 2011-04-07 LAB — BASIC METABOLIC PANEL
BUN: 14
BUN: 15
Calcium: 7.8 — ABNORMAL LOW
Calcium: 8.6
Chloride: 102
Creatinine, Ser: 0.9
Creatinine, Ser: 1.12
GFR calc Af Amer: >60
GFR calc Af Amer: >60
GFR calc non Af Amer: 51 — ABNORMAL LOW
GFR calc non Af Amer: 55 — ABNORMAL LOW
GFR calc non Af Amer: >60
Glucose, Bld: 108 — ABNORMAL HIGH
Glucose, Bld: 125 — ABNORMAL HIGH
Potassium: 3.4 — ABNORMAL LOW
Potassium: 3.6
Sodium: 137

## 2011-04-07 LAB — TYPE AND SCREEN
ABO/RH(D): A POS
Antibody Screen: NEGATIVE

## 2011-04-08 ENCOUNTER — Ambulatory Visit (INDEPENDENT_AMBULATORY_CARE_PROVIDER_SITE_OTHER): Payer: BC Managed Care – PPO | Admitting: Surgery

## 2011-04-08 ENCOUNTER — Encounter (INDEPENDENT_AMBULATORY_CARE_PROVIDER_SITE_OTHER): Payer: Self-pay | Admitting: Surgery

## 2011-04-08 DIAGNOSIS — R109 Unspecified abdominal pain: Secondary | ICD-10-CM

## 2011-04-08 DIAGNOSIS — IMO0002 Reserved for concepts with insufficient information to code with codable children: Secondary | ICD-10-CM

## 2011-04-08 DIAGNOSIS — B957 Other staphylococcus as the cause of diseases classified elsewhere: Secondary | ICD-10-CM

## 2011-04-08 NOTE — Progress Notes (Signed)
Visit Diagnoses: 1. Abdominal pain   2. Staphylococcus aureus infection, multiple-resistant (MRSA)     HISTORY: Patient returns for wound check. She has finished her course of doxycycline. She denies any fevers. She denies chills. She denies sweats. She has not seen any drainage from her abdominal wounds in approximately one week. She does note persistent diffuse anterior abdominal tenderness.   EXAM: Midline surgical wound is well healed. No sign of cellulitis. Drain sites are dry. With Valsalva there is no sign of recurrent hernia. On palpation however there is diffuse anterior abdominal wall tenderness. There is no crepitus.   IMPRESSION: #1 ventral hernia with incarcerated small bowel with obstruction, resolved #2 probable MRSA mesh infection   PLAN: Despite the patient appearing clinically improved, I am worried about MRSA infection of her abdominal wall mesh repair. I am going to check a CBC with differential today. If there is any abnormality we will obtain a CT scan of the abdomen and pelvis to evaluate the mass.  I have again discussed with the patient the fact that MRSA infection of the mesh is a high likelihood. If so she will require reoperation with removal of the mesh and open wound care and further antibiotic therapy. She understands. We will continue to monitor her closely.   Velora Heckler, MD, FACS General & Endocrine Surgery [facility]

## 2011-04-09 LAB — CBC WITH DIFFERENTIAL/PLATELET
Basophils Relative: 1 % (ref 0–1)
Eosinophils Relative: 8 % — ABNORMAL HIGH (ref 0–5)
HCT: 38.5 % (ref 36.0–46.0)
Hemoglobin: 12 g/dL (ref 12.0–15.0)
MCH: 31.2 pg (ref 26.0–34.0)
MCHC: 31.2 g/dL (ref 30.0–36.0)
MCV: 100 fL (ref 78.0–100.0)
Monocytes Absolute: 0.5 10*3/uL (ref 0.1–1.0)
Monocytes Relative: 8 % (ref 3–12)
Neutro Abs: 4.6 10*3/uL (ref 1.7–7.7)
RDW: 14.8 % (ref 11.5–15.5)

## 2011-04-09 LAB — WOUND CULTURE

## 2011-04-09 LAB — URINALYSIS, ROUTINE W REFLEX MICROSCOPIC
Glucose, UA: NEGATIVE
Hgb urine dipstick: NEGATIVE
Ketones, ur: NEGATIVE
Protein, ur: NEGATIVE
Urobilinogen, UA: 1

## 2011-04-09 LAB — DIFFERENTIAL
Basophils Absolute: 0.1
Basophils Relative: 1
Eosinophils Absolute: 0.3
Neutrophils Relative %: 69

## 2011-04-09 LAB — GRAM STAIN: Gram Stain: NONE SEEN

## 2011-04-09 LAB — BASIC METABOLIC PANEL
BUN: 18 mg/dL (ref 6–23)
BUN: 19
BUN: 19
CO2: 25
Chloride: 107
Chloride: 109
Creat: 0.9 mg/dL (ref 0.50–1.10)
Creatinine, Ser: 1.12
Glucose, Bld: 88 mg/dL (ref 70–99)
Potassium: 5.1 mEq/L (ref 3.5–5.3)
Potassium: 3.9

## 2011-04-09 LAB — CBC
MCHC: 33.8
MCV: 90.9
Platelets: 332

## 2011-04-09 LAB — PROTIME-INR
INR: 1
Prothrombin Time: 13.7

## 2011-04-09 LAB — ANAEROBIC CULTURE

## 2011-04-09 NOTE — Progress Notes (Signed)
Quick Note:  These results are acceptable for scheduled surgery. TMG ______ 

## 2011-04-10 ENCOUNTER — Telehealth (INDEPENDENT_AMBULATORY_CARE_PROVIDER_SITE_OTHER): Payer: Self-pay | Admitting: Surgery

## 2011-04-10 NOTE — Telephone Encounter (Signed)
Patient aware of lab results.

## 2011-04-15 ENCOUNTER — Other Ambulatory Visit (INDEPENDENT_AMBULATORY_CARE_PROVIDER_SITE_OTHER): Payer: Self-pay | Admitting: Surgery

## 2011-04-15 ENCOUNTER — Telehealth (INDEPENDENT_AMBULATORY_CARE_PROVIDER_SITE_OTHER): Payer: Self-pay | Admitting: General Surgery

## 2011-04-15 DIAGNOSIS — R509 Fever, unspecified: Secondary | ICD-10-CM

## 2011-04-15 LAB — CBC WITH DIFFERENTIAL/PLATELET
HCT: 37.3 % (ref 36.0–46.0)
Hemoglobin: 12.4 g/dL (ref 12.0–15.0)
Lymphocytes Relative: 10 % — ABNORMAL LOW (ref 12–46)
Lymphs Abs: 1.2 10*3/uL (ref 0.7–4.0)
MCV: 96.6 fL (ref 78.0–100.0)
Monocytes Absolute: 0.5 10*3/uL (ref 0.1–1.0)
Neutro Abs: 10.6 10*3/uL — ABNORMAL HIGH (ref 1.7–7.7)
RBC: 3.86 MIL/uL — ABNORMAL LOW (ref 3.87–5.11)
RDW: 14.5 % (ref 11.5–15.5)
WBC: 12.3 10*3/uL — ABNORMAL HIGH (ref 4.0–10.5)

## 2011-04-15 NOTE — Telephone Encounter (Signed)
Pt called stating she has been running a fever since Sunday. The highest was 101. Stated sx was 03/02/11, had hernia repair. Pt stated she was instructed by Dr. Gerrit Friends to call if she developed fever. Called Robin and transferred call to her at her request.

## 2011-04-16 ENCOUNTER — Ambulatory Visit (INDEPENDENT_AMBULATORY_CARE_PROVIDER_SITE_OTHER): Payer: BC Managed Care – PPO | Admitting: Surgery

## 2011-04-16 ENCOUNTER — Encounter (INDEPENDENT_AMBULATORY_CARE_PROVIDER_SITE_OTHER): Payer: Self-pay | Admitting: Surgery

## 2011-04-16 DIAGNOSIS — B957 Other staphylococcus as the cause of diseases classified elsewhere: Secondary | ICD-10-CM

## 2011-04-16 DIAGNOSIS — K436 Other and unspecified ventral hernia with obstruction, without gangrene: Secondary | ICD-10-CM

## 2011-04-16 NOTE — Progress Notes (Signed)
Chief Complaint  Patient presents with  . Routine Post Op    recheck incision, red and warm    HISTORY: Patient is a 60 year old female followed in my practice since her urgent surgery on March 03, 2011 for incarcerated ventral hernia. Postoperative course was complicated by MRSA infection. She has completed antibiotic therapy.  Over the past week the patient has developed increasing pain, erythema, fever. A CBC performed yesterday shows an elevated white blood cell count of 12.3 with 86% neutrophils. Patient presents today for physical examination.   Past Medical History  Diagnosis Date  . Diabetes mellitus   . Diarrhea   . Light headed     when pt lays down   . Hernia     ventral  . Hypertension   . Hyperlipidemia      Current Outpatient Prescriptions  Medication Sig Dispense Refill  . allopurinol (ZYLOPRIM) 300 MG tablet       . ALPRAZolam (XANAX) 0.25 MG tablet       . buPROPion (WELLBUTRIN XL) 300 MG 24 hr tablet       . furosemide (LASIX) 20 MG tablet       . lisinopril-hydrochlorothiazide (PRINZIDE,ZESTORETIC) 20-25 MG per tablet       . metoprolol (TOPROL-XL) 100 MG 24 hr tablet       . simvastatin (ZOCOR) 40 MG tablet          Allergies  Allergen Reactions  . Codeine     rash  . Penicillins     Fever      Family History  Problem Relation Age of Onset  . Cancer Father     pancreatic     History   Social History  . Marital Status: Married    Spouse Name: N/A    Number of Children: N/A  . Years of Education: N/A   Social History Main Topics  . Smoking status: Never Smoker   . Smokeless tobacco: None  . Alcohol Use: No  . Drug Use: No  . Sexually Active:    Other Topics Concern  . None   Social History Narrative  . None     REVIEW OF SYSTEMS - PERTINENT POSITIVES ONLY: Positive for fever, abdominal pain, irritability, sweats.   EXAM: Filed Vitals:   04/16/11 0925  BP: 132/70  Pulse: 72  Temp: 97.2 F (36.2 C)  Resp: 16     HEENT: normocephalic; pupils equal and reactive; sclerae clear; dentition good; mucous membranes moist NECK:  symmetric on extension; no palpable anterior or posterior cervical lymphadenopathy; no supraclavicular masses; no tenderness CHEST: clear to auscultation bilaterally without rales, rhonchi, or wheezes CARDIAC: regular rate and rhythm without significant murmur; peripheral pulses are full ABDOMEN: Abdominal wall shows increasing erythema particularly to the left of the midline incision. There is soft tissue edema. There is moderate tenderness across the entire anterior abdominal wall. There is no drainage. EXT:  non-tender without edema; no deformity NEURO: no gross focal deficits; no sign of tremor   LABORATORY RESULTS: See E-Chart for most recent results   RADIOLOGY RESULTS: See E-Chart or I-Site for most recent results   IMPRESSION: #1 history of emergent repair of incarcerated ventral hernia with small bowel obstruction utilizing mesh, August 2012 #2 personal history of MRSA infection left knee #3 infection of abdominal wall hernia repair with MRSA involving mesh   PLAN: Patient and I have previously discussed the likelihood of need for surgery for debridement of the gland and removal of the mass  due to MRSA infection. That time has arrived. Patient will be scheduled for urgent surgery tomorrow. I plan to explore the wound, obtain cultures, and lightly debride the synthetic mesh from the abdominal wall. Patient will require a few days of in-hospital dressing changes. I believe she would be a good candidate for a negative pressure a vacuum dressing. We have discussed the clinical course to be anticipated. We have discussed the likelihood of recurrent hernias.  The risks and benefits of the procedure have been discussed at length with the patient.  The patient understands the proposed procedure, potential alternative treatments, and the course of recovery to be expected.  All  of the patient's questions have been answered at this time.  The patient wishes to proceed with surgery and will schedule a date for their procedure through our office staff.   Velora Heckler, MD, FACS General & Endocrine Surgery [facility]      Visit Diagnoses: 1. Ventral hernia with obstruction   2. Staphylococcus aureus infection, multiple-resistant (MRSA)     Primary Care Physician: Bobbie Stack, MD, MD

## 2011-04-17 ENCOUNTER — Ambulatory Visit (HOSPITAL_COMMUNITY)
Admission: RE | Admit: 2011-04-17 | Discharge: 2011-04-17 | Disposition: A | Discharge status: Home / Self Care | Payer: BC Managed Care – PPO | Source: Ambulatory Visit | Attending: Surgery | Admitting: Surgery

## 2011-04-17 ENCOUNTER — Inpatient Hospital Stay (HOSPITAL_COMMUNITY)
Admission: RE | Admit: 2011-04-17 | Discharge: 2011-04-21 | DRG: 581 | Disposition: A | Discharge status: Home Health | Payer: BC Managed Care – PPO | Source: Ambulatory Visit | Attending: Surgery | Admitting: Surgery

## 2011-04-17 DIAGNOSIS — E119 Type 2 diabetes mellitus without complications: Secondary | ICD-10-CM | POA: Diagnosis present

## 2011-04-17 DIAGNOSIS — S31109A Unspecified open wound of abdominal wall, unspecified quadrant without penetration into peritoneal cavity, initial encounter: Secondary | ICD-10-CM

## 2011-04-17 DIAGNOSIS — L03319 Cellulitis of trunk, unspecified: Secondary | ICD-10-CM | POA: Diagnosis present

## 2011-04-17 DIAGNOSIS — Y838 Other surgical procedures as the cause of abnormal reaction of the patient, or of later complication, without mention of misadventure at the time of the procedure: Secondary | ICD-10-CM | POA: Diagnosis present

## 2011-04-17 DIAGNOSIS — I10 Essential (primary) hypertension: Secondary | ICD-10-CM | POA: Diagnosis present

## 2011-04-17 DIAGNOSIS — IMO0002 Reserved for concepts with insufficient information to code with codable children: Secondary | ICD-10-CM | POA: Diagnosis present

## 2011-04-17 DIAGNOSIS — A4902 Methicillin resistant Staphylococcus aureus infection, unspecified site: Secondary | ICD-10-CM | POA: Diagnosis present

## 2011-04-17 DIAGNOSIS — T8140XA Infection following a procedure, unspecified, initial encounter: Principal | ICD-10-CM | POA: Diagnosis present

## 2011-04-17 DIAGNOSIS — L02219 Cutaneous abscess of trunk, unspecified: Secondary | ICD-10-CM | POA: Diagnosis present

## 2011-04-17 HISTORY — PX: APPLICATION OF WOUND VAC: SHX5189

## 2011-04-17 HISTORY — PX: ABDOMINAL EXPLORATION SURGERY: SHX538

## 2011-04-17 LAB — PROTIME-INR
INR: 1.1 (ref 0.00–1.49)
Prothrombin Time: 14.4 seconds (ref 11.6–15.2)

## 2011-04-17 LAB — DIFFERENTIAL
Eosinophils Absolute: 0.4 10*3/uL (ref 0.0–0.7)
Lymphs Abs: 1 10*3/uL (ref 0.7–4.0)
Monocytes Relative: 9 % (ref 3–12)
Neutrophils Relative %: 76 % (ref 43–77)

## 2011-04-17 LAB — URINALYSIS, ROUTINE W REFLEX MICROSCOPIC
Glucose, UA: NEGATIVE mg/dL
Protein, ur: NEGATIVE mg/dL
pH: 5.5 (ref 5.0–8.0)

## 2011-04-17 LAB — COMPREHENSIVE METABOLIC PANEL
ALT: 41 U/L — ABNORMAL HIGH (ref 0–35)
AST: 35 U/L (ref 0–37)
Albumin: 2.9 g/dL — ABNORMAL LOW (ref 3.5–5.2)
Alkaline Phosphatase: 156 U/L — ABNORMAL HIGH (ref 39–117)
Chloride: 98 mEq/L (ref 96–112)
Potassium: 3.8 mEq/L (ref 3.5–5.1)
Sodium: 135 mEq/L (ref 135–145)
Total Bilirubin: 0.8 mg/dL (ref 0.3–1.2)
Total Protein: 8.3 g/dL (ref 6.0–8.3)

## 2011-04-17 LAB — CBC
Hemoglobin: 11.7 g/dL — ABNORMAL LOW (ref 12.0–15.0)
MCH: 31.5 pg (ref 26.0–34.0)
Platelets: 392 10*3/uL (ref 150–400)
RBC: 3.71 MIL/uL — ABNORMAL LOW (ref 3.87–5.11)
WBC: 9.4 10*3/uL (ref 4.0–10.5)

## 2011-04-17 LAB — URINE MICROSCOPIC-ADD ON

## 2011-04-20 LAB — WOUND CULTURE: Gram Stain: NONE SEEN

## 2011-04-20 LAB — CBC
HCT: 30.5 % — ABNORMAL LOW (ref 36.0–46.0)
Hemoglobin: 9.9 g/dL — ABNORMAL LOW (ref 12.0–15.0)
MCH: 31 pg (ref 26.0–34.0)
MCHC: 32.5 g/dL (ref 30.0–36.0)
MCV: 95.6 fL (ref 78.0–100.0)

## 2011-04-20 LAB — BASIC METABOLIC PANEL
BUN: 16 mg/dL (ref 6–23)
Chloride: 95 mEq/L — ABNORMAL LOW (ref 96–112)
GFR calc non Af Amer: 65 mL/min — ABNORMAL LOW (ref >90)
Glucose, Bld: 136 mg/dL — ABNORMAL HIGH (ref 70–99)
Sodium: 129 mEq/L — ABNORMAL LOW (ref 135–145)

## 2011-04-20 NOTE — Op Note (Signed)
[name], Flores NO.:  [id number]  MEDICAL RECORD NO.:  [id number]  LOCATION:  1524                         FACILITY:  [facility]  PHYSICIAN:  Velora Heckler, MD      DATE OF BIRTH:  December 20, 1950  DATE OF PROCEDURE:  04/17/2011                               OPERATIVE REPORT   PREOPERATIVE DIAGNOSES:  Abdominal wall cellulitis, mesh infection.  POSTOPERATIVE DIAGNOSES:  Abdominal wall cellulitis, large seroma.  PROCEDURES: 1. Wound exploration. 2. Debridement of subcutaneous tissues. 3. Open packing of abdominal wound.  SURGEON:  Velora Heckler, M.D., FACS  ANESTHESIA:  General.  ANESTHESIOLOGIST:  Jenelle Mages. Fortune, M.D.  ESTIMATED BLOOD LOSS:  Minimal.  PREPARATION:  ChloraPrep.  COMPLICATIONS:  None.  INDICATIONS:  The patient is a 60 year old female who underwent urgent repair of incarcerated ventral hernia with small bowel obstruction in August 2012 at [facility].  Five separate fascial defects were repaired and supported with an onlay polypropylene mesh. Postoperatively, the patient did well and was discharged.  However, on followup in the office, she was noted to have cloudy fluid draining from the right subcutaneous drain.  Cultures were obtained and demonstrated MRSA.  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  The patient was placed on oral doxycycline for 3 weeks.  She improved.  However, on discontinuing oral antibiotics, she developed progressive abdominal tenderness, abdominal wall swelling, and evidence of abdominal wall cellulitis.  The patient was seen in my office and a complete blood count was obtained showing an elevated white blood cell count of 12,000 with the left shift.  The patient was, therefore, prepared and brought to the operating room today for wound exploration and possible debridement of mesh.  BODY OF REPORT:  Procedure was done in OR #1  at [facility].  The patient was brought to the operating room, placed in supine position on the operating room table.  Following administration of general anesthesia, the patient was prepped and draped in the usual strict aseptic fashion.  After ascertaining that an adequate level of anesthesia had been achieved, the patient's previous midline abdominal incision was reopened with the electrocautery.  Dissection was carried through subcutaneous tissues and hemostasis obtained with the electrocautery.  A large seroma was entered.  It was multiloculated. Loculations were broken down manually.  Cultures were obtained for the Laboratory.  Tissue was debrided from the subcutaneous tissues and the seroma.  All loculations were broken down.  Cavity was irrigated copiously with warm saline and evacuated.  The mesh on the abdominal wall actually appeared to be incorporating quite well.  There was ingrowth of granulation tissue throughout.  There did not appear to be any area of mesh which was unincorporated at this point in time.  There was no sign of recurrent hernia.  Based on these operative findings, a decision was made to pack the wound open.  Therefore, Betadine-soaked Kerlix gauze sponges were inserted into the seroma cavity and the entire wound was filled with Betadine- soaked gauze.  Three complete 4 inches rolls of Kerlix were utilized. Dry gauze dressings and ABD  pads were placed on the abdominal wall.  The patient was awakened from anesthesia and brought to the recovery room. The patient tolerated the procedure well.   Velora Heckler, MD, FACS     TMG/MEDQ  D:  04/17/2011  T:  04/18/2011  Job:  [id number]  Electronically Signed by Darnell Level MD on 04/20/2011 11:41:49 AM

## 2011-04-22 LAB — ANAEROBIC CULTURE

## 2011-04-27 LAB — BASIC METABOLIC PANEL
BUN: 14
CO2: 25
Calcium: 8.2 — ABNORMAL LOW
Calcium: 8.5
Chloride: 103
Chloride: 104
GFR calc Af Amer: >60
GFR calc Af Amer: >60
GFR calc non Af Amer: 57 — ABNORMAL LOW
GFR calc non Af Amer: >60
Potassium: 4
Potassium: 4.1
Sodium: 136
Sodium: 137
Sodium: 139

## 2011-04-27 LAB — PROTIME-INR
INR: 1
INR: 1.1
INR: 1.3
INR: 1.6 — ABNORMAL HIGH
INR: 1.7 — ABNORMAL HIGH
Prothrombin Time: 13
Prothrombin Time: 16.3 — ABNORMAL HIGH
Prothrombin Time: 19.8 — ABNORMAL HIGH
Prothrombin Time: 20.6 — ABNORMAL HIGH

## 2011-04-27 LAB — CBC
HCT: 31.5 — ABNORMAL LOW
HCT: 34.2 — ABNORMAL LOW
Hemoglobin: 10.8 — ABNORMAL LOW
Hemoglobin: 11.2 — ABNORMAL LOW
Hemoglobin: 11.7 — ABNORMAL LOW
MCHC: 34.8
MCV: 93.9
MCV: 94.3
Platelets: 211
RBC: 3.44 — ABNORMAL LOW
WBC: 10.1
WBC: 8.2
WBC: 8.6

## 2011-04-28 LAB — DIFFERENTIAL
Eosinophils Absolute: 0.3
Eosinophils Relative: 4
Lymphocytes Relative: 17
Lymphs Abs: 1.1
Monocytes Absolute: 0.4
Monocytes Relative: 6

## 2011-04-28 LAB — BASIC METABOLIC PANEL
CO2: 31
Calcium: 9.5
Creatinine, Ser: 0.88
GFR calc Af Amer: >60
GFR calc non Af Amer: >60
Sodium: 142

## 2011-04-28 LAB — URINALYSIS, ROUTINE W REFLEX MICROSCOPIC
Glucose, UA: NEGATIVE
Hgb urine dipstick: NEGATIVE
Protein, ur: NEGATIVE
Specific Gravity, Urine: 1.017
pH: 6

## 2011-04-28 LAB — CBC
Hemoglobin: 13.3
MCHC: 34
RBC: 4.11
WBC: 6.5

## 2011-04-29 NOTE — Discharge Summary (Signed)
  NAMEGAVYN, ZOSS NO.:  [id number]  MEDICAL RECORD NO.:  [id number]  LOCATION:  1524                         FACILITY:  [facility]  PHYSICIAN:  Velora Heckler, MD      DATE OF BIRTH:  Dec 01, 1950  DATE OF ADMISSION:  04/17/2011 DATE OF DISCHARGE:  04/21/2011                              DISCHARGE SUMMARY   REASON FOR ADMISSION:  Abdominal wall cellulitis, infected abdominal wound.  BRIEF HISTORY:  The patient is a 60 year old female who underwent emergent repair of multiple ventral hernias in August 2012.  This required placement of prosthetic mesh.  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  Subsequently, the patient did develop a wound infection.  This was treated with drainage and antibiotics.  However, cultures grew MRSA.  After completing her course of oral antibiotic therapy, she developed abdominal wall cellulitis, pain, and an apparent wound infection.  The patient now returns to the operating room for wound exploration and debridement.  HOSPITAL COURSE:  The patient was admitted on October 5th.  She was taken directly to the operating room where she underwent exploration of her abdominal wound with debridement and placement of open dressing changes.  She was given intravenous antibiotics.  Aerobic and anaerobic cultures were obtained, which unfortunately have grown out methicillin- resistant Staph aureus.  The patient was switched from IV Cipro to oral doxycycline.  Negative pressure wound therapy has been instituted and home health nursing has been arranged.  The patient was prepared for discharge home on the 4th postoperative day.  DISCHARGE PLAN:  The patient be discharged home today, April 21, 2011, in good condition, tolerating a regular diet, and ambulating independently.  DISCHARGE MEDICATIONS:  Will include Percocet as needed for pain, and doxycycline 100 mg b.i.d. for 21 days.  [facility]  will provide home health nursing services for negative pressure wound therapy.  The patient will return to my office at [facility] for wound check in 2 weeks.  FINAL DIAGNOSES:  Abdominal wall cellulitis, methicillin-resistant Staphylococcus aureus wound infection.  CONDITION AT DISCHARGE:  Improved.   Velora Heckler, MD, FACS     TMG/MEDQ  D:  04/21/2011  T:  04/21/2011  Job:  [id number]  Electronically Signed by Darnell Level MD on 04/29/2011 11:42:58 AM

## 2011-05-06 ENCOUNTER — Encounter (INDEPENDENT_AMBULATORY_CARE_PROVIDER_SITE_OTHER): Payer: Self-pay | Admitting: Surgery

## 2011-05-06 ENCOUNTER — Ambulatory Visit (INDEPENDENT_AMBULATORY_CARE_PROVIDER_SITE_OTHER): Payer: BC Managed Care – PPO | Admitting: Surgery

## 2011-05-06 VITALS — BP 138/90 | HR 80 | Resp 20 | Ht 68.0 in | Wt 316.2 lb

## 2011-05-06 DIAGNOSIS — B957 Other staphylococcus as the cause of diseases classified elsewhere: Secondary | ICD-10-CM

## 2011-05-06 NOTE — Patient Instructions (Signed)
Continue vacuum dressing.  TMG

## 2011-05-06 NOTE — Progress Notes (Signed)
Visit Diagnoses: 1. Staphylococcus aureus infection, multiple-resistant (MRSA)     HISTORY: The patient returns for wound check having undergone debridement of abdominal wall wound. She has a vacuum dressing in place.   EXAM: Wound is clean and granulating. Minimal drainage. Mesh is largely incorporated. No loose mesh or suture material at this time.   IMPRESSION: Complex abdominal wound healing by secondary intention, MRSA infection on long-term antibiotic   PLAN: Patient will complete her course of doxycycline in 5 days. We will continue the vacuum dressing for now. She will return to see me in 3 weeks for wound check.   Velora Heckler, MD, FACS General & Endocrine Surgery [facility]

## 2011-05-14 NOTE — H&P (Signed)
  NAMEITXEL, WICKARD NO.:  [id number]  MEDICAL RECORD NO.:  [id number]  LOCATION:  1524                         FACILITY:  [facility]  PHYSICIAN:  Velora Heckler, MD      DATE OF BIRTH:  08-11-1950  DATE OF ADMISSION:  04/17/2011 DATE OF DISCHARGE:  04/21/2011                             HISTORY & PHYSICAL   REASON FOR ADMISSION:  Abdominal wall cellulitis, infection of abdominal wound.  HISTORY:  The patient is a 60 year old white female who underwent urgent repair of incarcerated ventral hernia with small bowel obstruction in August 2012 at [facility].  Five separate fascial defects were repaired and supported with polypropylene mesh. Postoperatively, the patient did well and was discharged home.  However, the patient had a distant history of MRSA infection.  She developed cloudy drainage from her subcutaneous drains.  Cultures were obtained and showed MRSA infection.  The patient was placed on oral antibiotics with some improvement.  However, she has continued to have abdominal tenderness, edema, and evidence of abdominal wall cellulitis.  LABORATORY STUDIES:  Showed an elevated white blood cell count of 12,000 with a left shift.  Therefore, the patient is prepared and brought to the hospital for admission and operative intervention for abdominal wall cellulitis and infected abdominal wound.  PAST MEDICAL HISTORY:  As noted above. 1. Morbid obesity. 2. Gout. 3. Depression. 4. Hypertension.  MEDICATIONS:  Allopurinol, metoprolol, simvastatin, Wellbutrin, Xanax, doxycycline.  ALLERGIES: 1. PENICILLIN. 2. CODEINE.  SOCIAL HISTORY:  The patient is married.  She does not smoke.  She does not drink alcohol.  FAMILY HISTORY:  Noncontributory.  REVIEW OF SYSTEMS:  Fifteen system review documented in the medical record.  PHYSICAL EXAMINATION:  GENERAL:  A 60 year old morbidly obese, white female, in no acute distress. VITAL SIGNS:   Temperature 99.0, pulse 89, respirations 20, blood pressure 126/75, O2 saturation 95% on room air. HEENT:  Shows her to be normocephalic.  Sclerae clear.  Dentition fair. Mucous membranes moist.  Voice normal. NECK:  Supple, nontender without mass. LUNGS:  Clear to auscultation bilaterally without rales, rhonchi, or wheeze. CARDIAC EXAM:  Shows regular rate and rhythm without murmur.  Peripheral pulses are full. EXTREMITIES:  Nontender without significant edema. ABDOMEN:  Moderately tender.  There is erythema and induration involving the abdominal wall near the site of her surgical incision, greater on the right side than the left. NEUROLOGICAL:  She is alert and oriented without focal deficit.  There is no sign of tremor.  LABORATORY STUDIES:  See chart.  PREOP RADIOGRAPHS:  See chart.  IMPRESSION:  Abdominal wall cellulitis, likely methicillin-resistant Staphylococcus aureus infection involving mesh.  PLAN:  The patient will be admitted to the General Surgery Service.  She will go directly to the operating room for wound exploration and debridement.     Velora Heckler, MD     TMG/MEDQ  D:  05/12/2011  T:  05/12/2011  Job:  [id number]  Electronically Signed by Darnell Level MD on 05/14/2011 12:35:13 PM

## 2011-05-23 ENCOUNTER — Other Ambulatory Visit (INDEPENDENT_AMBULATORY_CARE_PROVIDER_SITE_OTHER): Payer: Self-pay | Admitting: Surgery

## 2011-05-26 ENCOUNTER — Ambulatory Visit (INDEPENDENT_AMBULATORY_CARE_PROVIDER_SITE_OTHER): Payer: BC Managed Care – PPO | Admitting: Surgery

## 2011-05-26 ENCOUNTER — Encounter (INDEPENDENT_AMBULATORY_CARE_PROVIDER_SITE_OTHER): Payer: Self-pay | Admitting: Surgery

## 2011-05-26 VITALS — BP 132/84 | HR 80 | Temp 97.2°F | Resp 20 | Ht 68.0 in | Wt 319.6 lb

## 2011-05-26 DIAGNOSIS — B957 Other staphylococcus as the cause of diseases classified elsewhere: Secondary | ICD-10-CM

## 2011-05-26 DIAGNOSIS — IMO0002 Reserved for concepts with insufficient information to code with codable children: Secondary | ICD-10-CM

## 2011-05-26 NOTE — Progress Notes (Signed)
Visit Diagnoses: 1. Staphylococcus aureus infection, multiple-resistant (MRSA)     HISTORY: Patient returns for a wound check. Midline abdominal incision continues to heal by secondary intention. Vacuum dressing is in place.  EXAM: Vacuum sponges are removed in their entirety. There is a suture present in the mesh at the base of the wound and it is removed. Mesh appears to be incorporating into the abdominal wall. There is minimal drainage present in the sponges. There was a slight odor. There is no sign of cellulitis. There is no fluctuance. There was only minor tenderness.  IMPRESSION: MRSA infection of mesh following emergent ventral incisional hernia repair, wound healing by secondary intention after debridement  PLAN: Patient has completed all of her antibiotic therapy. Wound is making good progress on vacuum therapy. We will continue vacuum therapy for approximately 2 more weeks. At that time I will reassess the wound and see if we can discontinue vacuum therapy and do more local wound care.   Velora Heckler, MD, FACS General & Endocrine Surgery [facility]

## 2011-05-26 NOTE — Patient Instructions (Signed)
Continue vacuum dressing for two weeks.  tmg

## 2011-06-03 ENCOUNTER — Encounter (INDEPENDENT_AMBULATORY_CARE_PROVIDER_SITE_OTHER): Payer: BC Managed Care – PPO | Admitting: Surgery

## 2011-06-08 ENCOUNTER — Encounter (INDEPENDENT_AMBULATORY_CARE_PROVIDER_SITE_OTHER): Payer: Self-pay | Admitting: General Surgery

## 2011-06-08 ENCOUNTER — Ambulatory Visit (INDEPENDENT_AMBULATORY_CARE_PROVIDER_SITE_OTHER): Payer: BC Managed Care – PPO | Admitting: General Surgery

## 2011-06-08 VITALS — BP 128/76 | HR 70 | Temp 96.7°F | Resp 18 | Ht 68.0 in | Wt 325.8 lb

## 2011-06-08 DIAGNOSIS — Z4889 Encounter for other specified surgical aftercare: Secondary | ICD-10-CM

## 2011-06-08 DIAGNOSIS — Z5189 Encounter for other specified aftercare: Secondary | ICD-10-CM

## 2011-06-08 NOTE — Progress Notes (Signed)
Subjective:     Patient ID: [name], female   DOB: 07/16/1950, 60 y.o.   MRN: [id number]  HPI This patient follows up today for evaluation of her abdominal wound. She is status post ventral hernia repair with mesh in August of 2012 which was palpated with postoperative infection requiring reoperation. She has been doing abdominal wound care and  getting the VAC changes 3 times per week. She was told to follow up today for recheck of her wound as there was concern for some change in color of the wound. She denies any fevers or chills or any other systemic changes. She does state that the wound is slightly more tender.  Review of Systems     Objective:   Physical Exam Her incision is healing well and she has good granulation tissue at the base of the wound. She does have what appears to be some exposed mesh and some suture material at the base of the wound but is well incorporated. The skin edges are somewhat rolled in and slightly macerated from moisture. She has brownish scab-like material on the left side of the wound approximately 1 inch from the skin edge which appears to be granulation tissue but I did not see any significant sign of infection.    Assessment:     Status post ventral hernia repair with wound complications.  I do not see any sign of infection. The wound appears to be healing well. I think that the skin discoloration and tenderness are due to skin maceration and moisture at the skin edges on the epithelialized and healthy skin. I recommended that the moisture remain in the base of the wound and the skin edges and off the epithelialized skin.    Plan:     Continue wound care with particular attention to the protecting the skin and possibly applying barrier creams. Otherwise continue current wound care as this appears to be healing fine.

## 2011-06-16 ENCOUNTER — Encounter (INDEPENDENT_AMBULATORY_CARE_PROVIDER_SITE_OTHER): Payer: BC Managed Care – PPO | Admitting: Surgery

## 2011-06-17 ENCOUNTER — Encounter (INDEPENDENT_AMBULATORY_CARE_PROVIDER_SITE_OTHER): Payer: Self-pay | Admitting: Surgery

## 2011-06-17 ENCOUNTER — Ambulatory Visit (INDEPENDENT_AMBULATORY_CARE_PROVIDER_SITE_OTHER): Payer: BC Managed Care – PPO | Admitting: Surgery

## 2011-06-17 VITALS — BP 128/82 | HR 84 | Temp 97.2°F | Resp 20 | Ht 68.0 in | Wt 325.0 lb

## 2011-06-17 DIAGNOSIS — B957 Other staphylococcus as the cause of diseases classified elsewhere: Secondary | ICD-10-CM

## 2011-06-17 NOTE — Patient Instructions (Signed)
Wet to dry dressing changes twice daily. Add 3 tablespoons of Chlorox to saline liter. Shower once daily. tmg

## 2011-06-17 NOTE — Progress Notes (Addendum)
Visit Diagnoses: 1. Staphylococcus aureus infection, multiple-resistant (MRSA)     HISTORY: Patient returns today for wound check. Chronic abdominal wound healing by secondary intention with vacuum dressing.  EXAM: Wound is clean and granulating. There is still some mesh exposed at the base of the wound for an area approximately 1 cm x 3 cm in size. There is some drainage with odor. There is no sign of cellulitis. Vacuum dressing is completely removed. Wound is packed wet-to-dry with normal saline moistened 4 x 4 gauze.  IMPRESSION: Chronic abdominal wound with exposed mesh, history of MRSA infection, healing by secondary intention  PLAN: Begin twice daily wet to dry dressing changes with normal saline and coarse mesh gauze. Discontinue vacuum dressing. Patient will return in 4 weeks for wound check.   Velora Heckler, MD, FACS General & Endocrine Surgery [facility]  Addendum Patient is to remain out of work until her next office visit in January 2013. At that point we will reassess her wound healing and make a decision about returning to work.  Velora Heckler, MD, FACS General & Endocrine Surgery [facility]

## 2011-06-19 ENCOUNTER — Telehealth (INDEPENDENT_AMBULATORY_CARE_PROVIDER_SITE_OTHER): Payer: Self-pay

## 2011-06-19 NOTE — Telephone Encounter (Signed)
[facility] called to confirm wound care orders. Orders read to nurse from 12-5 office visit with Dr Gerrit Friends. [facility] will continue home visits 2-3 times weekly to monitor wound.

## 2011-07-22 ENCOUNTER — Ambulatory Visit (INDEPENDENT_AMBULATORY_CARE_PROVIDER_SITE_OTHER): Payer: BC Managed Care – PPO | Admitting: Surgery

## 2011-07-22 ENCOUNTER — Encounter (INDEPENDENT_AMBULATORY_CARE_PROVIDER_SITE_OTHER): Payer: Self-pay | Admitting: Surgery

## 2011-07-22 DIAGNOSIS — B957 Other staphylococcus as the cause of diseases classified elsewhere: Secondary | ICD-10-CM

## 2011-07-22 DIAGNOSIS — K436 Other and unspecified ventral hernia with obstruction, without gangrene: Secondary | ICD-10-CM

## 2011-07-22 NOTE — Patient Instructions (Signed)
Stop wet dressing changes.  Shower wound daily.  Apply antibiotic ointment twice a day.  Cleanse wound with Qtip and Hydrogen peroxide once a day.  tmg

## 2011-07-22 NOTE — Progress Notes (Signed)
Visit Diagnoses: 1. Staphylococcus aureus infection, multiple-resistant (MRSA)   2. Ventral hernia with obstruction     HISTORY: Patient returns for wound check.  EXAM: Midline abdominal incision has nearly completely granulated closed. The remaining open wound measures 1.5 cm x 1 cm x 1 cm. There is exposed mesh at the base of the wound. Wound is treated with topical antibiotic ointment and covered with a dry gauze dressing.  IMPRESSION: Complex abdominal wound with exposed mesh, healing by secondary intention  PLAN: The wound is closed enough at this point that it does not require wet-to-dry dressing changes. I have asked the patient to shower the wound once daily. I've asked her to cleanse the wound with a cotton tip applicator and hydrogen peroxide once daily. I've asked her to apply topical antibiotic ointment twice daily.  Patient will return in 4-6 weeks for followup wound check.  Velora Heckler, MD, FACS General & Endocrine Surgery [facility]

## 2011-08-17 ENCOUNTER — Ambulatory Visit (INDEPENDENT_AMBULATORY_CARE_PROVIDER_SITE_OTHER): Payer: BC Managed Care – PPO | Admitting: Surgery

## 2011-08-17 ENCOUNTER — Encounter (INDEPENDENT_AMBULATORY_CARE_PROVIDER_SITE_OTHER): Payer: Self-pay | Admitting: Surgery

## 2011-08-17 DIAGNOSIS — K436 Other and unspecified ventral hernia with obstruction, without gangrene: Secondary | ICD-10-CM

## 2011-08-17 DIAGNOSIS — B957 Other staphylococcus as the cause of diseases classified elsewhere: Secondary | ICD-10-CM

## 2011-08-17 NOTE — Patient Instructions (Signed)
Continue twice daily dressing changes.  tmg 

## 2011-08-17 NOTE — Progress Notes (Signed)
Visit Diagnoses: 1. Ventral hernia with obstruction   2. Staphylococcus aureus infection, multiple-resistant (MRSA)     HISTORY: Patient returns for wound check. Midline abdominal incision continues to slowly granulate and heal by secondary intention. She is doing twice daily dressing changes.  EXAM: Incision continues to contract. The wound is essentially completely re\re epithelialized except for an area in the central portion that measures 1.5 x 1 x 1 cm. There is exposed mesh at the base of the wound although the tissue surrounding it appears to be healthy granulation tissue. There is no drainage. There is no odor. Surfaces are cauterized with silver nitrate and the wound is repacked wet-to-dry with 2 x 2 gauze and saline. Only abdominal wound there is no sign of cellulitis. There is no fluctuance. There is no sign of seroma. With Valsalva there is no sign of recurrent hernia.  IMPRESSION: #1 history of multiple abdominal wall herniae #2 chronic midline abdominal incision healing by secondary intention #3 personal history of MRSA infection  PLAN: Patient and her husband had a lengthy discussion. She is currently on leave of absence from her job. Given the multiple abdominal surgeries and hernias as well as her morbid obesity, I think it is unlikely that she can have her return to do a type of work which will require repetitive heavy lifting.  Patient will continue twice daily dressing changes. She will return in 6 weeks for wound check. I am still felt that the chronic midline abdominal wound heal by secondary intention.  Velora Heckler, MD, FACS General & Endocrine Surgery [facility]

## 2011-09-30 ENCOUNTER — Encounter (INDEPENDENT_AMBULATORY_CARE_PROVIDER_SITE_OTHER): Payer: Self-pay | Admitting: Surgery

## 2011-09-30 ENCOUNTER — Ambulatory Visit (INDEPENDENT_AMBULATORY_CARE_PROVIDER_SITE_OTHER): Payer: BC Managed Care – PPO | Admitting: Surgery

## 2011-09-30 VITALS — BP 154/96 | HR 86 | Temp 97.0°F | Ht 68.0 in | Wt 352.2 lb

## 2011-09-30 DIAGNOSIS — K436 Other and unspecified ventral hernia with obstruction, without gangrene: Secondary | ICD-10-CM

## 2011-09-30 DIAGNOSIS — B957 Other staphylococcus as the cause of diseases classified elsewhere: Secondary | ICD-10-CM

## 2011-09-30 NOTE — Progress Notes (Signed)
Visit Diagnoses: 1. Staphylococcus aureus infection, multiple-resistant (MRSA)   2. Ventral hernia with obstruction     HISTORY: Patient is a 61 year old white female who underwent urgent repair of incarcerated ventral hernia with mesh. Due to morbid obesity her postoperative course was complicated. She had had previous infections with methicillin-resistant staph and indeed developed MRSA infection in her wound. Her wound has been slow to she'll by secondary intention and she returns today for a final wound check.  EXAM: Wound is essentially completely re\re epithelialized. There are 2 small areas measuring less than 1 cm with granulation tissue. These are cauterized with silver nitrate and dressed with topical Neosporin and a Band-Aid. There is no sign of recurrent hernia.  IMPRESSION: #1 status post repair of ventral incisional hernia with mesh #2 history of MRSA infection #3 morbid obesity  PLAN: The patient is released from our practice today. She will continue to dress these two small areas with Neosporin ointment as needed. She will contact us if she develops any further problems with her abdominal wound.  Velora Heckler, MD, FACS General & Endocrine Surgery [facility]

## 2011-09-30 NOTE — Patient Instructions (Signed)
Neosporin ointment to wound as needed.  tmg

## 2012-10-17 ENCOUNTER — Other Ambulatory Visit: Payer: Self-pay | Admitting: Obstetrics and Gynecology

## 2012-10-17 ENCOUNTER — Other Ambulatory Visit (HOSPITAL_COMMUNITY)
Admission: RE | Admit: 2012-10-17 | Discharge: 2012-10-17 | Disposition: A | Discharge status: Home / Self Care | Payer: Self-pay | Source: Ambulatory Visit | Attending: Obstetrics and Gynecology | Admitting: Obstetrics and Gynecology

## 2012-10-17 DIAGNOSIS — Z1151 Encounter for screening for human papillomavirus (HPV): Secondary | ICD-10-CM | POA: Insufficient documentation

## 2012-10-17 DIAGNOSIS — Z01419 Encounter for gynecological examination (general) (routine) without abnormal findings: Secondary | ICD-10-CM | POA: Insufficient documentation

## 2013-09-05 ENCOUNTER — Ambulatory Visit (INDEPENDENT_AMBULATORY_CARE_PROVIDER_SITE_OTHER): Payer: Medicare Other | Admitting: Surgery

## 2013-09-26 ENCOUNTER — Encounter (INDEPENDENT_AMBULATORY_CARE_PROVIDER_SITE_OTHER): Payer: Self-pay | Admitting: Surgery

## 2013-09-26 ENCOUNTER — Ambulatory Visit (INDEPENDENT_AMBULATORY_CARE_PROVIDER_SITE_OTHER): Payer: Medicare Other | Admitting: Surgery

## 2013-09-26 VITALS — BP 160/120 | HR 86 | Temp 98.2°F | Resp 16 | Ht 67.0 in | Wt 367.8 lb

## 2013-09-26 DIAGNOSIS — T8140XA Infection following a procedure, unspecified, initial encounter: Secondary | ICD-10-CM | POA: Diagnosis not present

## 2013-09-26 DIAGNOSIS — T8149XA Infection following a procedure, other surgical site, initial encounter: Secondary | ICD-10-CM

## 2013-09-26 DIAGNOSIS — K469 Unspecified abdominal hernia without obstruction or gangrene: Secondary | ICD-10-CM | POA: Diagnosis not present

## 2013-09-26 NOTE — Patient Instructions (Signed)
MRSA Overview  MRSA stands for methicillin-resistant Staphylococcus aureus. It is a type of bacteria that is resistant to some common antibiotics. It can cause infections in the skin and many other places in the body. Staphylococcus aureus, often called "staph," is a bacteria that normally lives on the skin or in the nose. Staph on the surface of the skin or in the nose does not cause problems. However, if the staph enters the body through a cut, wound, or break in the skin, an infection can happen.  Up until recently, infections with the MRSA type of staph mainly occurred in hospitals and other healthcare settings. There are now increasing problems with MRSA infections in the community as well. Infections with MRSA may be very serious or even life-threatening. Most MRSA infections are acquired in one of two ways:  · Healthcare-associated MRSA (HA-MRSA)  · This can be acquired by people in any healthcare setting. MRSA can be a big problem for hospitalized people, people in nursing homes, people in rehabilitation facilities, people with weakened immune systems, dialysis patients, and those who have had surgery.  · Community-associated MRSA (CA-MRSA)  · Community spread of MRSA is becoming more common. It is known to spread in crowded settings, in jails and prisons, and in situations where there is close skin-to-skin contact, such as during sporting events or in locker rooms. MRSA can be spread through shared items, such as children's toys, razors, towels, or sports equipment.  CAUSES   All staph, including MRSA, are normally harmless unless they enter the body through a scratch, cut, or wound, such as with surgery. All staph, including MRSA, can be spread from person-to-person by touching contaminated objects or through direct contact.  SPECIAL GROUPS  MRSA can present problems for special groups of people. Some of these groups include:  · Breastfeeding women.  · The most common problem is MRSA infection of the  breast (mastitis). There is evidence that MRSA can be passed to an infant from infected breast milk. Your caregiver may recommend that you stop breastfeeding until the mastitis is under control.  · If you are breastfeeding and have a MRSA infection in a place other than the breast, you may usually continue breastfeeding while under treatment. If taking antibiotics, ask your caregiver if it is safe to continue breastfeeding while taking your prescribed medicines.  · Neonates (babies from birth to 1 month old) and infants (babies from 1 month to 1 year old).  · There is evidence that MRSA can be passed to a newborn at birth if the mother has MRSA on the skin, in or around the birth canal, or an infection in the uterus, cervix, or vagina. MRSA infection can have the same appearance as a normal newborn or infant rash or several other skin infections. This can make it hard to diagnose MRSA.  · Immune compromised people.  · If you have an immune system problem, you may have a higher chance of developing a MRSA infection.  · People after any type of surgery.  · Staph in general, including MRSA, is the most common cause of infections occurring at the site of recent surgery.  · People on long-term steroid medicines.  · These kinds of medicines can lower your resistance to infection. This can increase your chance of getting MRSA.  · People who have had frequent hospitalizations, live in nursing homes or other residential care facilities, have venous or urinary catheters, or have taken multiple courses of antibiotic therapy for any reason.    DIAGNOSIS   Diagnosis of MRSA is done by cultures of fluid samples that may come from:  · Swabs taken from cuts or wounds in infected areas.  · Nasal swabs.  · Saliva or deep cough specimens from the lungs (sputum).  · Urine.  · Blood.  Many people are "colonized" with MRSA but have no signs of infection. This means that people carry the MRSA germ on their skin or in their nose and may  never develop MRSA infection.   TREATMENT   Treatment varies and is based on how serious, how deep, or how extensive the infection is. For example:  · Some skin infections, such as a small boil or abscess, may be treated by draining yellowish-white fluid (pus) from the site of the infection.  · Deeper or more widespread soft tissue infections are usually treated with surgery to drain pus and with antibiotic medicine given by vein or by mouth. This may be recommended even if you are pregnant.  · Serious infections may require a hospital stay.  If antibiotics are given, they may be needed for several weeks.  PREVENTION   Because many people are colonized with staph, including MRSA, preventing the spread of the bacteria from person-to-person is most important. The best way to prevent the spread of bacteria and other germs is through proper hand washing or by using alcohol-based hand disinfectants. The following are other ways to help prevent MRSA infection within the hospital and community settings.   · Healthcare settings:  · Strict hand washing or hand disinfection procedures need to be followed before and after touching every patient.  · Patients infected with MRSA are placed in isolation to prevent the spread of the bacteria.  · Healthcare workers need to wear disposable gowns and gloves when touching or caring for patients infected with MRSA. Visitors may also be asked to wear a gown and gloves.  · Hospital surfaces need to be disinfected frequently.  · Community settings:  · Wash your hands frequently with soap and water for at least 15 seconds. Otherwise, use alcohol-based hand disinfectants when soap and water is not available.  · Make sure people who live with you wash their hands often, too.  · Do not share personal items. For example, avoid sharing razors and other personal hygiene items, towels, clothing, and athletic equipment.  · Wash and dry your clothes and bedding at the warmest temperatures  recommended on the labels.  · Keep wounds covered. Pus from infected sores may contain MRSA and other bacteria. Keep cuts and abrasions clean and covered with germ-free (sterile), dry bandages until they are healed.  · If you have a wound that appears infected, ask your caregiver if a culture for MRSA and other bacteria should be done.  · If you are breastfeeding, talk to your caregiver about MRSA. You may be asked to temporarily stop breastfeeding.  HOME CARE INSTRUCTIONS   · Take your antibiotics as directed. Finish them even if you start to feel better.  · Avoid close contact with those around you as much as possible. Do not use towels, razors, toothbrushes, bedding, or other items that will be used by others.  · To fight the infection, follow your caregiver's instructions for wound care. Wash your hands before and after changing your bandages.  · If you have an intravascular device, such as a catheter, make sure you know how to care for it.  · Be sure to tell any healthcare providers that you have MRSA   so they are aware of your infection.  SEEK IMMEDIATE MEDICAL CARE IF:   · The infection appears to be getting worse. Signs include:  · Increased warmth, redness, or tenderness around the wound site.  · A red line that extends from the infection site.  · A dark color in the area around the infection.  · Wound drainage that is tan, yellow, or green.  · A bad smell coming from the wound.  · You feel sick to your stomach (nauseous) and throw up (vomit) or cannot keep medicine down.  · You have a fever.  · Your baby is older than 3 months with a rectal temperature of 102° F (38.9° C) or higher.  · Your baby is 3 months old or younger with a rectal temperature of 100.4° F (38° C) or higher.  · You have difficulty breathing.  MAKE SURE YOU:   · Understand these instructions.  · Will watch your condition.  · Will get help right away if you are not doing well or get worse.  Document Released: 06/29/2005 Document Revised:  09/21/2011 Document Reviewed: 10/01/2010  ExitCare® Patient Information ©2014 [employer].

## 2013-09-26 NOTE — Progress Notes (Signed)
General Surgery [facility]  Chief Complaint  Patient presents with  . Follow-up    eval drainage from  previous surgery site - ventral incisional hernia repair with mesh 2012    HISTORY: Patient is a 63 year old female known to my surgical practice from emergent repair of ventral incisional hernia in 2012. Patient had multiple fascial defects requiring application of mesh. She had a history of MRSA infection. She developed infection in the mesh repair and required a return to the operating room for debridement. Wound was allowed to heal by secondary intention and completely closed by the spring of 2013. She has not been seen in my practice since that time.  The patient has experienced ongoing drainage from the umbilicus. This has been intermittent. Sometimes the drainage is clear and sometimes the drainage is cloudy and foul-smelling.  Past Medical History  Diagnosis Date  . Diabetes mellitus   . Diarrhea   . Light headed     when pt lays down   . Hernia     ventral  . Hypertension   . Hyperlipidemia   . Arthritis     Current Outpatient Prescriptions  Medication Sig Dispense Refill  . allopurinol (ZYLOPRIM) 300 MG tablet       . ALPRAZolam (XANAX) 0.25 MG tablet       . buPROPion (WELLBUTRIN XL) 300 MG 24 hr tablet       . FLUoxetine (PROZAC) 10 MG capsule       . furosemide (LASIX) 20 MG tablet       . lisinopril-hydrochlorothiazide (PRINZIDE,ZESTORETIC) 20-25 MG per tablet       . metoprolol (TOPROL-XL) 100 MG 24 hr tablet       . simvastatin (ZOCOR) 40 MG tablet       . vitamin B-12 (CYANOCOBALAMIN) 1000 MCG tablet Take 1,000 mcg by mouth daily.       No current facility-administered medications for this visit.    Allergies  Allergen Reactions  . Codeine     rash  . Penicillins     Fever     Family History  Problem Relation Age of Onset  . Cancer Father     pancreatic  . Cancer Mother     lung cancer  . Cancer Maternal Grandmother    breast cancer-left    History   Social History  . Marital Status: Married    Spouse Name: N/A    Number of Children: N/A  . Years of Education: N/A   Social History Main Topics  . Smoking status: Never Smoker   . Smokeless tobacco: Never Used  . Alcohol Use: No  . Drug Use: No  . Sexual Activity: None   Other Topics Concern  . None   Social History Narrative  . None    REVIEW OF SYSTEMS - PERTINENT POSITIVES ONLY: Intermittent drainage from umbilicus, intermittent vaginal discharge  EXAM: Filed Vitals:   09/26/13 1646  BP: 160/120  Pulse: 86  Temp: 98.2 F (36.8 C)  Resp: 16    GENERAL: well-developed, well-nourished, no acute distress HEENT: normocephalic; pupils equal and reactive; sclerae clear; mucous membranes moist NECK:  symmetric on extension; no palpable anterior or posterior cervical lymphadenopathy; no supraclavicular masses; no tenderness CHEST: clear to auscultation bilaterally without rales, rhonchi, or wheezes CARDIAC: regular rate and rhythm without significant murmur; peripheral pulses are full ABDOMEN: Morbidly obese; well-healed midline surgical wound; serous drainage from umbilicus; no sign of cellulitis; umbilicus is explored with a Q-tip  and hydrogen peroxide and there does not appear to be any underlying cavity were exposed mesh EXT:  non-tender without edema; no deformity NEURO: no gross focal deficits; no sign of tremor   LABORATORY RESULTS: See [facility] ([facility]) for most recent results  RADIOLOGY RESULTS: See [facility] ([facility]L-Epic) for most recent results  IMPRESSION: #1 history of complex ventral incisional hernia repair with mesh #2 history of MRSA infection #3 persistent intermittent drainage from umbilicus  PLAN: I am concerned that the patient they have a chronic infection of the underlying mesh, given her history of MRSA infection and intermittent drainage from the umbilicus. We will obtain a CBC and a Bmet. We  will then obtain a CT scan of the abdomen and pelvis with contrast to evaluate the abdominal wall for possible recurrent hernia were for signs of mesh infection. I have discussed this with the patient and her family.  Removal of the mesh and reconstruction of the abdominal wall would be a Herculean effort in this morbidly obese patient. That would certainly require referral to a tertiary care center. Prior to such a drastic step, I would consider referral to infectious disease and treatment with suppressive doses of antibiotics on a chronic basis.  We will obtain laboratory studies and the CT scan. I will contact the patient with the results. We will make a decision regarding next step's in management following those results.  Velora Hecklerodd M. Nataline Basara, MD, FACS General & Endocrine Surgery [facility]  Primary Care Physician: Thora LanceEHINGER,ROBERT R, MD

## 2013-09-27 LAB — CBC WITH DIFFERENTIAL/PLATELET
Basophils Absolute: 0.1 10*3/uL (ref 0.0–0.1)
Basophils Relative: 1 % (ref 0–1)
EOS PCT: 5 % (ref 0–5)
Eosinophils Absolute: 0.3 10*3/uL (ref 0.0–0.7)
HEMATOCRIT: 39.3 % (ref 36.0–46.0)
Hemoglobin: 13.5 g/dL (ref 12.0–15.0)
LYMPHS ABS: 1.2 10*3/uL (ref 0.7–4.0)
LYMPHS PCT: 18 % (ref 12–46)
MCH: 32.9 pg (ref 26.0–34.0)
MCHC: 34.4 g/dL (ref 30.0–36.0)
MCV: 95.9 fL (ref 78.0–100.0)
Monocytes Absolute: 0.5 10*3/uL (ref 0.1–1.0)
Monocytes Relative: 7 % (ref 3–12)
NEUTROS ABS: 4.6 10*3/uL (ref 1.7–7.7)
Neutrophils Relative %: 69 % (ref 43–77)
PLATELETS: 267 10*3/uL (ref 150–400)
RBC: 4.1 MIL/uL (ref 3.87–5.11)
RDW: 14.1 % (ref 11.5–15.5)
WBC: 6.7 10*3/uL (ref 4.0–10.5)

## 2013-09-27 LAB — BASIC METABOLIC PANEL
BUN: 22 mg/dL (ref 6–23)
CHLORIDE: 100 meq/L (ref 96–112)
CO2: 32 meq/L (ref 19–32)
CREATININE: 1.09 mg/dL (ref 0.50–1.10)
Calcium: 8.8 mg/dL (ref 8.4–10.5)
Glucose, Bld: 177 mg/dL — ABNORMAL HIGH (ref 70–99)
POTASSIUM: 4.4 meq/L (ref 3.5–5.3)
SODIUM: 139 meq/L (ref 135–145)

## 2013-09-28 NOTE — Progress Notes (Signed)
Pt advised of lab results and will proceed with CT tomorrow. Thanks, Weyerhaeuser CompanyCindy

## 2013-09-28 NOTE — Progress Notes (Signed)
Quick Note:  WBC and diff are normal - no sign of infection.  Proceed with CT scan.  Velora Hecklerodd M. Maudean Hoffmann, MD, [facility] Office: [phone]   ______

## 2013-09-29 ENCOUNTER — Ambulatory Visit
Admission: RE | Admit: 2013-09-29 | Discharge: 2013-09-29 | Disposition: A | Discharge status: Home / Self Care | Payer: Medicare Other | Source: Ambulatory Visit | Attending: Surgery | Admitting: Surgery

## 2013-09-29 DIAGNOSIS — K469 Unspecified abdominal hernia without obstruction or gangrene: Secondary | ICD-10-CM

## 2013-09-29 DIAGNOSIS — N281 Cyst of kidney, acquired: Secondary | ICD-10-CM | POA: Diagnosis not present

## 2013-09-29 MED ORDER — IOHEXOL 300 MG/ML  SOLN
125.0000 mL | Freq: Once | INTRAMUSCULAR | Status: AC | PRN
  Administered 2013-09-29: 125 mL via INTRAVENOUS

## 2013-10-04 ENCOUNTER — Telehealth (INDEPENDENT_AMBULATORY_CARE_PROVIDER_SITE_OTHER): Payer: Self-pay

## 2013-10-04 NOTE — Telephone Encounter (Signed)
Message copied by Joanette GulaSMITHEY, Yoandri Congrove on Wed Oct 04, 2013  4:28 PM ------      Message from: Isaias SakaiGALLOWAY, WENDY K      Created: Wed Oct 04, 2013  2:52 PM      Regarding: Dr Joanie CoddingtonGerkin      Contact: [phone]       Would like result of CT scan done on Fri 09/29/13 ------

## 2013-10-04 NOTE — Telephone Encounter (Signed)
I will forward request from pt to have Dr Gerrit FriendsGerkin review CT and advise response to pt.

## 2013-10-05 ENCOUNTER — Telehealth (INDEPENDENT_AMBULATORY_CARE_PROVIDER_SITE_OTHER): Payer: Self-pay | Admitting: General Surgery

## 2013-10-05 NOTE — Telephone Encounter (Signed)
Pt called again for CT results.  Restated that Dr. Gerrit FriendsGerkin needs to review so we can update her with results and recommendations.  She is very anxious.

## 2013-10-05 NOTE — Telephone Encounter (Signed)
Ct report printed and given to Dr Gerrit FriendsGerkin to review.

## 2013-10-06 ENCOUNTER — Telehealth (INDEPENDENT_AMBULATORY_CARE_PROVIDER_SITE_OTHER): Payer: Self-pay

## 2013-10-06 DIAGNOSIS — E1129 Type 2 diabetes mellitus with other diabetic kidney complication: Secondary | ICD-10-CM | POA: Diagnosis not present

## 2013-10-06 DIAGNOSIS — Z0000 Encounter for general adult medical examination without abnormal findings: Secondary | ICD-10-CM | POA: Diagnosis not present

## 2013-10-06 DIAGNOSIS — F329 Major depressive disorder, single episode, unspecified: Secondary | ICD-10-CM | POA: Diagnosis not present

## 2013-10-06 DIAGNOSIS — I129 Hypertensive chronic kidney disease with stage 1 through stage 4 chronic kidney disease, or unspecified chronic kidney disease: Secondary | ICD-10-CM | POA: Diagnosis not present

## 2013-10-06 DIAGNOSIS — E7800 Pure hypercholesterolemia, unspecified: Secondary | ICD-10-CM | POA: Diagnosis not present

## 2013-10-06 DIAGNOSIS — Z23 Encounter for immunization: Secondary | ICD-10-CM | POA: Diagnosis not present

## 2013-10-06 DIAGNOSIS — Z1211 Encounter for screening for malignant neoplasm of colon: Secondary | ICD-10-CM | POA: Diagnosis not present

## 2013-10-06 DIAGNOSIS — M109 Gout, unspecified: Secondary | ICD-10-CM | POA: Diagnosis not present

## 2013-10-06 DIAGNOSIS — N183 Chronic kidney disease, stage 3 unspecified: Secondary | ICD-10-CM | POA: Diagnosis not present

## 2013-10-06 NOTE — Telephone Encounter (Signed)
I called pt and read the impression from her CT to her. I advised her that Dr Gerrit FriendsGerkin will call her with recommendations re: continued drainage at old wd site. I have placed copy of report on Dr Ardine EngGerkin's desk.  Pt states she understands and will looking forward to his call. Pt can be reached at [phone].

## 2013-10-07 ENCOUNTER — Other Ambulatory Visit (INDEPENDENT_AMBULATORY_CARE_PROVIDER_SITE_OTHER): Payer: Self-pay | Admitting: Surgery

## 2013-10-07 ENCOUNTER — Telehealth (INDEPENDENT_AMBULATORY_CARE_PROVIDER_SITE_OTHER): Payer: Self-pay | Admitting: Surgery

## 2013-10-07 DIAGNOSIS — S31109A Unspecified open wound of abdominal wall, unspecified quadrant without penetration into peritoneal cavity, initial encounter: Principal | ICD-10-CM

## 2013-10-07 DIAGNOSIS — L089 Local infection of the skin and subcutaneous tissue, unspecified: Secondary | ICD-10-CM

## 2013-10-07 MED ORDER — SULFAMETHOXAZOLE-TMP DS 800-160 MG PO TABS: 1.0000 | ORAL_TABLET | Freq: Two times a day (BID) | ORAL | Status: DC

## 2013-10-07 NOTE — Telephone Encounter (Signed)
Received this message:   Call Documentation     Kristi GulaCynthia Smithey, LPN at 6/29/52843/27/2015 2:31 PM     Status: Signed        I called pt and read the impression from her CT to her. I advised her that Dr Gerrit FriendsGerkin will call her with recommendations re: continued drainage at old wd site. I have placed copy of report on Dr Ardine EngGerkin's desk. Pt states she understands and will looking forward to his call. Pt can be reached at [phone].             I contacted patient with results of CT scan today.  Will try Bactrim DS for one month.  Evaluate in [facility] office in one month.  Velora Hecklerodd M. Tehya Leath, MD, [facility] Office: [phone]

## 2013-10-07 NOTE — Progress Notes (Signed)
Quick Note:  I reviewed CT scan and contacted patient with results. Fortunately no recurrence of hernia and no abscess or fistula formation.  Will treat with Bactrim DS twice daily for one month.  Will evaluate in office in one month.  Velora Hecklerodd M. Jimmey Hengel, MD, [facility] Office: [phone]   ______

## 2013-10-09 ENCOUNTER — Telehealth (INDEPENDENT_AMBULATORY_CARE_PROVIDER_SITE_OTHER): Payer: Self-pay

## 2013-10-09 NOTE — Telephone Encounter (Signed)
Appt for pt made for one month per Dr Ardine EngGerkin's request. Pt aware of appt.

## 2013-10-11 ENCOUNTER — Other Ambulatory Visit: Payer: Self-pay

## 2013-10-11 DIAGNOSIS — Z1231 Encounter for screening mammogram for malignant neoplasm of breast: Secondary | ICD-10-CM

## 2013-10-20 ENCOUNTER — Ambulatory Visit: Payer: Medicare Other

## 2013-10-24 ENCOUNTER — Ambulatory Visit
Admission: RE | Admit: 2013-10-24 | Discharge: 2013-10-24 | Disposition: A | Discharge status: Home / Self Care | Payer: Medicare Other | Source: Ambulatory Visit

## 2013-10-24 DIAGNOSIS — Z1231 Encounter for screening mammogram for malignant neoplasm of breast: Secondary | ICD-10-CM

## 2013-10-24 DIAGNOSIS — Z803 Family history of malignant neoplasm of breast: Secondary | ICD-10-CM | POA: Diagnosis not present

## 2013-10-30 DIAGNOSIS — N183 Chronic kidney disease, stage 3 unspecified: Secondary | ICD-10-CM | POA: Diagnosis not present

## 2013-10-30 DIAGNOSIS — Z1211 Encounter for screening for malignant neoplasm of colon: Secondary | ICD-10-CM | POA: Diagnosis not present

## 2013-11-08 DIAGNOSIS — I129 Hypertensive chronic kidney disease with stage 1 through stage 4 chronic kidney disease, or unspecified chronic kidney disease: Secondary | ICD-10-CM | POA: Diagnosis not present

## 2013-11-08 DIAGNOSIS — M62838 Other muscle spasm: Secondary | ICD-10-CM | POA: Diagnosis not present

## 2013-11-15 ENCOUNTER — Ambulatory Visit (INDEPENDENT_AMBULATORY_CARE_PROVIDER_SITE_OTHER): Payer: Medicare Other | Admitting: Surgery

## 2013-11-15 ENCOUNTER — Encounter (INDEPENDENT_AMBULATORY_CARE_PROVIDER_SITE_OTHER): Payer: Self-pay | Admitting: Surgery

## 2013-11-15 VITALS — BP 120/74 | HR 75 | Temp 97.8°F | Resp 16 | Ht 67.0 in | Wt 365.0 lb

## 2013-11-15 DIAGNOSIS — S31109A Unspecified open wound of abdominal wall, unspecified quadrant without penetration into peritoneal cavity, initial encounter: Principal | ICD-10-CM

## 2013-11-15 DIAGNOSIS — B957 Other staphylococcus as the cause of diseases classified elsewhere: Secondary | ICD-10-CM

## 2013-11-15 DIAGNOSIS — Z1635 Resistance to multiple antimicrobial drugs: Secondary | ICD-10-CM

## 2013-11-15 DIAGNOSIS — T148XXA Other injury of unspecified body region, initial encounter: Secondary | ICD-10-CM

## 2013-11-15 DIAGNOSIS — IMO0002 Reserved for concepts with insufficient information to code with codable children: Secondary | ICD-10-CM

## 2013-11-15 DIAGNOSIS — K436 Other and unspecified ventral hernia with obstruction, without gangrene: Secondary | ICD-10-CM

## 2013-11-15 DIAGNOSIS — L089 Local infection of the skin and subcutaneous tissue, unspecified: Secondary | ICD-10-CM

## 2013-11-15 NOTE — Progress Notes (Signed)
General Surgery [facility]  Chief Complaint  Patient presents with  . Follow-up    drainage from abdominal wound / hx of ventral hernia repair with mesh    HISTORY: Patient is a 63 year old female known to my practice from complex repair of ventral incisional hernia. She had experienced onset of drainage from the wound. She was placed on oral Bactrim. After approximately 2-1/2 weeks there has been no further drainage. There is no sign of cellulitis or infection. CT scan results are reviewed with the patient.  PERTINENT REVIEW OF SYSTEMS: Drainage from area of umbilicus and wound resolved after 2-1/2 weeks of antibiotic therapy  EXAM: HEENT: normocephalic; pupils equal and reactive; sclerae clear; dentition good; mucous membranes moist NECK:  symmetric on extension; no palpable anterior or posterior cervical lymphadenopathy; no supraclavicular masses; no tenderness ABDOMEN: Soft, obese, with completely epithelialized midline wound; umbilicus appears grossly normal; no sign of drainage; no fluctuance, minimal tenderness EXT:  non-tender without edema; no deformity NEURO: no gross focal deficits; no sign of tremor   IMPRESSION: #1 resolution of drainage from umbilicus and surgical wound with antibiotic therapy #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 Will discontinue antibiotics at that time. If there is recurrence of drainage from the umbilicus or wound, we will start Bactrim twice daily again. We will then make referral to infectious disease for evaluation for possible chronic antibiotic therapy. I discussed this at length with the patient today. She understands and agrees with this plan.  Velora Hecklerodd M. Juwon Scripter, MD, [facility] Office: [phone]  Visit Diagnoses: 1. Chronic wound infection of abdomen, umbilicus   2. Ventral hernia with obstruction   3.  Staphylococcus aureus infection, multiple-resistant (MRSA)

## 2013-11-16 ENCOUNTER — Encounter (HOSPITAL_COMMUNITY): Payer: Self-pay | Admitting: Pharmacy Technician

## 2013-11-24 ENCOUNTER — Encounter (HOSPITAL_COMMUNITY): Payer: Self-pay | Admitting: *Deleted

## 2013-12-01 ENCOUNTER — Other Ambulatory Visit: Payer: Self-pay | Admitting: Gastroenterology

## 2013-12-01 NOTE — Addendum Note (Signed)
Addended by: Markhi Kleckner on: 12/01/2013 12:58 PM   Modules accepted: Orders  

## 2013-12-07 ENCOUNTER — Ambulatory Visit (HOSPITAL_COMMUNITY): Payer: Medicare Other | Admitting: Certified Registered Nurse Anesthetist

## 2013-12-07 ENCOUNTER — Encounter (HOSPITAL_COMMUNITY): Payer: Self-pay | Admitting: Certified Registered Nurse Anesthetist

## 2013-12-07 ENCOUNTER — Ambulatory Visit (HOSPITAL_COMMUNITY)
Admission: RE | Admit: 2013-12-07 | Discharge: 2013-12-07 | Disposition: A | Discharge status: Home / Self Care | Payer: Medicare Other | Source: Ambulatory Visit | Attending: Gastroenterology | Admitting: Gastroenterology

## 2013-12-07 ENCOUNTER — Encounter (HOSPITAL_COMMUNITY): Payer: Medicare Other | Admitting: Certified Registered Nurse Anesthetist

## 2013-12-07 ENCOUNTER — Encounter (HOSPITAL_COMMUNITY)
Admission: RE | Disposition: A | Discharge status: Home / Self Care | Payer: Self-pay | Source: Ambulatory Visit | Attending: Gastroenterology

## 2013-12-07 DIAGNOSIS — K644 Residual hemorrhoidal skin tags: Secondary | ICD-10-CM | POA: Diagnosis not present

## 2013-12-07 DIAGNOSIS — E119 Type 2 diabetes mellitus without complications: Secondary | ICD-10-CM | POA: Insufficient documentation

## 2013-12-07 DIAGNOSIS — K648 Other hemorrhoids: Secondary | ICD-10-CM | POA: Insufficient documentation

## 2013-12-07 DIAGNOSIS — Z1211 Encounter for screening for malignant neoplasm of colon: Secondary | ICD-10-CM | POA: Insufficient documentation

## 2013-12-07 DIAGNOSIS — K5730 Diverticulosis of large intestine without perforation or abscess without bleeding: Secondary | ICD-10-CM | POA: Diagnosis not present

## 2013-12-07 DIAGNOSIS — I10 Essential (primary) hypertension: Secondary | ICD-10-CM | POA: Diagnosis not present

## 2013-12-07 HISTORY — PX: COLONOSCOPY: SHX5424

## 2013-12-07 HISTORY — DX: Other complications of anesthesia, initial encounter: T88.59XA

## 2013-12-07 HISTORY — DX: Adverse effect of unspecified anesthetic, initial encounter: T41.45XA

## 2013-12-07 LAB — GLUCOSE, CAPILLARY: Glucose-Capillary: 143 mg/dL — ABNORMAL HIGH (ref 70–99)

## 2013-12-07 LAB — BASIC METABOLIC PANEL
BUN: 24 mg/dL — ABNORMAL HIGH (ref 6–23)
CO2: 23 mEq/L (ref 19–32)
Calcium: 9.3 mg/dL (ref 8.4–10.5)
Chloride: 99 mEq/L (ref 96–112)
Creatinine, Ser: 1.39 mg/dL — ABNORMAL HIGH (ref 0.50–1.10)
GFR calc Af Amer: 47 mL/min — ABNORMAL LOW (ref >90)
GFR calc non Af Amer: 40 mL/min — ABNORMAL LOW (ref >90)
Glucose, Bld: 143 mg/dL — ABNORMAL HIGH (ref 70–99)
Potassium: 4 mEq/L (ref 3.7–5.3)
Sodium: 137 mEq/L (ref 137–147)

## 2013-12-07 SURGERY — COLONOSCOPY
Anesthesia: Monitor Anesthesia Care

## 2013-12-07 MED ORDER — FENTANYL CITRATE 0.05 MG/ML IJ SOLN
INTRAMUSCULAR | Status: DC | PRN
  Administered 2013-12-07 (×2): 50 ug via INTRAVENOUS

## 2013-12-07 MED ORDER — PROPOFOL 10 MG/ML IV BOLUS
INTRAVENOUS | Status: DC | PRN
  Administered 2013-12-07: 40 mg via INTRAVENOUS
  Administered 2013-12-07: 20 mg via INTRAVENOUS
  Administered 2013-12-07: 50 mg via INTRAVENOUS
  Administered 2013-12-07: 40 mg via INTRAVENOUS

## 2013-12-07 MED ORDER — KETAMINE HCL 10 MG/ML IJ SOLN
INTRAMUSCULAR | Status: AC
  Filled 2013-12-07: qty 1

## 2013-12-07 MED ORDER — FENTANYL CITRATE 0.05 MG/ML IJ SOLN
INTRAMUSCULAR | Status: AC
  Filled 2013-12-07: qty 2

## 2013-12-07 MED ORDER — PROPOFOL 10 MG/ML IV BOLUS
INTRAVENOUS | Status: AC
  Filled 2013-12-07: qty 20

## 2013-12-07 MED ORDER — SODIUM CHLORIDE 0.9 % IV SOLN
INTRAVENOUS | Status: DC
Start: 2013-12-07 — End: 2013-12-07

## 2013-12-07 MED ORDER — MIDAZOLAM HCL 2 MG/2ML IJ SOLN
INTRAMUSCULAR | Status: AC
  Filled 2013-12-07: qty 2

## 2013-12-07 MED ORDER — MIDAZOLAM HCL 5 MG/5ML IJ SOLN
INTRAMUSCULAR | Status: DC | PRN
  Administered 2013-12-07 (×2): 1 mg via INTRAVENOUS

## 2013-12-07 MED ORDER — KETAMINE HCL 10 MG/ML IJ SOLN
INTRAMUSCULAR | Status: DC | PRN
  Administered 2013-12-07: 20 mg via INTRAVENOUS

## 2013-12-07 MED ORDER — LACTATED RINGERS IV SOLN
INTRAVENOUS | Status: DC
  Administered 2013-12-07: 1000 mL via INTRAVENOUS

## 2013-12-07 NOTE — Discharge Instructions (Signed)
Call if question or problem otherwise recheck colon screening in 10 years

## 2013-12-07 NOTE — Transfer of Care (Signed)
Immediate Anesthesia Transfer of Care Note  Patient: [name]  Procedure(s) Performed: Procedure(s): COLONOSCOPY (N/A)  Patient Location: PACU and Endoscopy Unit  Anesthesia Type:MAC  Level of Consciousness: awake, alert , oriented, patient cooperative and responds to stimulation  Airway & Oxygen Therapy: Patient Spontanous Breathing and Patient connected to nasal cannula oxygen  Post-op Assessment: Report given to PACU RN, Post -op Vital signs reviewed and stable and Patient moving all extremities  Post vital signs: Reviewed and stable  Complications: No apparent anesthesia complications

## 2013-12-07 NOTE — Op Note (Signed)
[facility] [address] [place], [address]   COLONOSCOPY PROCEDURE REPORT  PATIENT: Kristi, Flores.  MR#: [id number] BIRTHDATE: [birth year] , 62  yrs. old GENDER: Female ENDOSCOPIST: Vida Rigger, MD REFERRED [id number] Manus Gunning, M.D.  Darnell Level, M.D. PROCEDURE DATE:  12/07/2013 PROCEDURE:   Colonoscopy, screening ASA CLASS:   Class III INDICATIONS:Colorectal cancer screening and Average risk patient for colon cancer. MEDICATIONS: propofol (Diprivan) 150mg  IV, Fentanyl 100 mcg IV, and Versed 2 mg IV   40 mg ketamine  DESCRIPTION OF PROCEDURE:   After the risks benefits and alternatives of the procedure were thoroughly explained, informed consent was obtained.  The Pentax Ped Colon [id number]  endoscope was introduced through the anus and advanced to the terminal ileum which was intubated for a short distance , limited by No adverse events experienced.   The quality of the prep was adequate. .  The instrument was then slowly withdrawn as the colon was fully examined.the findings are recorded below andthe patient tolerated the procedure well there was no obvious immediate complication and to advance to the cecum did not require any abdominal pressure or any position change      FINDINGS: 1 small internal/external hemorrhoids2 few scattered left and distal transverse diverticuli 3 otherwise within normal limits to the terminal ileum  COMPLICATIONS: none  IMPRESSION:  above  RECOMMENDATIONS: GI followup when necessary repeat colon screening in 10 years   _______________________________ eSigned:  Vida Rigger, MD 12/07/2013 8:55 AM   [id number] Manus Gunning, MD Darnell Level, MD

## 2013-12-07 NOTE — Anesthesia Preprocedure Evaluation (Addendum)
Anesthesia Evaluation  Patient identified by MRN, date of birth, ID band Patient awake    Reviewed: Allergy & Precautions, H&P , NPO status , Patient's Chart, lab work & pertinent test results  Airway Mallampati: III TM Distance: >3 FB Neck ROM: Full    Dental no notable dental hx. (+) Chipped, Poor Dentition   Pulmonary neg pulmonary ROS,  breath sounds clear to auscultation  Pulmonary exam normal       Cardiovascular hypertension, Pt. on medications negative cardio ROS  Rhythm:Regular Rate:Normal     Neuro/Psych negative neurological ROS  negative psych ROS   GI/Hepatic negative GI ROS, Neg liver ROS,   Endo/Other  negative endocrine ROSdiabetes, Type obesity  Renal/GU negative Renal ROS  negative genitourinary   Musculoskeletal negative musculoskeletal ROS (+)   Abdominal   Peds negative pediatric ROS (+)  Hematology negative hematology ROS (+)   Anesthesia Other Findings   Reproductive/Obstetrics negative OB ROS                          Anesthesia Physical Anesthesia Plan  ASA: III  Anesthesia Plan: MAC   Post-op Pain Management:    Induction:   Airway Management Planned: Simple Face Mask and Nasal Cannula  Additional Equipment:   Intra-op Plan:   Post-operative Plan:   Informed Consent: I have reviewed the patients History and Physical, chart, labs and discussed the procedure including the risks, benefits and alternatives for the proposed anesthesia with the patient or authorized representative who has indicated his/her understanding and acceptance.   Dental advisory given  Plan Discussed with: CRNA  Anesthesia Plan Comments:         Anesthesia Quick Evaluation

## 2013-12-07 NOTE — Anesthesia Postprocedure Evaluation (Signed)
  Anesthesia Post-op Note  Patient: [name]  Procedure(s) Performed: Procedure(s) (LRB): COLONOSCOPY (N/A)  Patient Location: PACU  Anesthesia Type: MAC  Level of Consciousness: awake and alert   Airway and Oxygen Therapy: Patient Spontanous Breathing  Post-op Pain: mild  Post-op Assessment: Post-op Vital signs reviewed, Patient's Cardiovascular Status Stable, Respiratory Function Stable, Patent Airway and No signs of Nausea or vomiting  Last Vitals:  Filed Vitals:   12/07/13 0925  BP:   Pulse: 78  Temp:   Resp: 20    Post-op Vital Signs: stable   Complications: No apparent anesthesia complications

## 2013-12-07 NOTE — Progress Notes (Signed)
[name] 8:14 AM  Subjective: Patient without any GI complaints and no new complaints since I saw her recently in the office  Objective: Vital signs stable afebrile no acute distress exam please see pre-assessment evaluation  Assessment: Colonic screening  Plan: Okay to proceed with colonoscopy with anesthesia assistance today  Lavada Mesi [facility]

## 2013-12-08 ENCOUNTER — Encounter (HOSPITAL_COMMUNITY): Payer: Self-pay | Admitting: Gastroenterology

## 2014-04-12 IMAGING — CT CT ABD-PELV W/ CM
2 of 5 series · 16 of 46 positions shown, 18 images · IV contrast (3 & [ID] OMNI 300)
Comparison: CT ABD/PELVIS W CM dated 03/02/2011; CT ABD/PELVIS W CM
dated 02/19/2010

CLINICAL DATA: Draining anterior abdominal wall incision status
post ventral hernia repair x7. No history of malignancy.

EXAM:
CT ABDOMEN AND PELVIS WITH CONTRAST
TECHNIQUE: Multidetector CT imaging of the abdomen and pelvis was performed
using the standard protocol following bolus administration of
intravenous contrast.
CONTRAST:  125mL OMNIPAQUE IOHEXOL 300 MG/ML  SOLN

[Series 100: abd/pelvis with · axial · 0.98mm/px · z∈[-558,-88]mm · 13 of 106 slices shown, 15 images]
[im 6/106  soft-tissue]
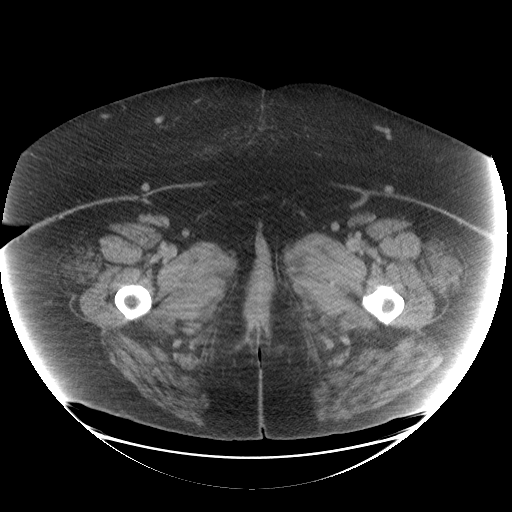
[im 6/106  bone]
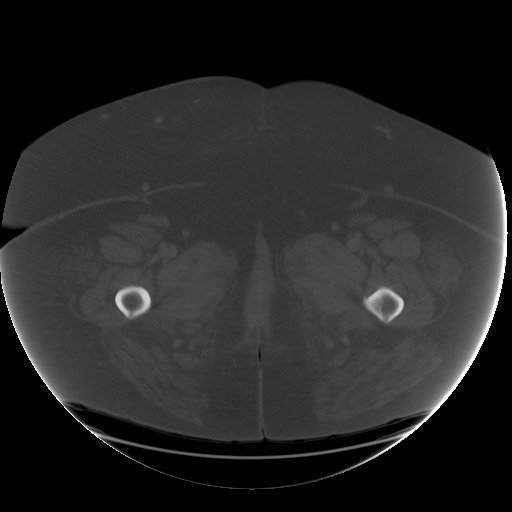
[im 12/106  soft-tissue]
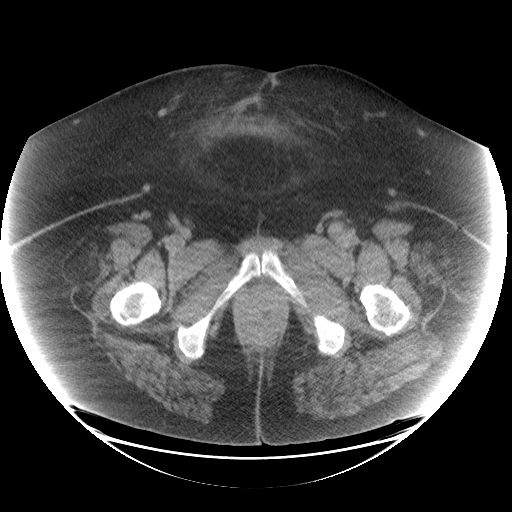
[im 24/106  soft-tissue]
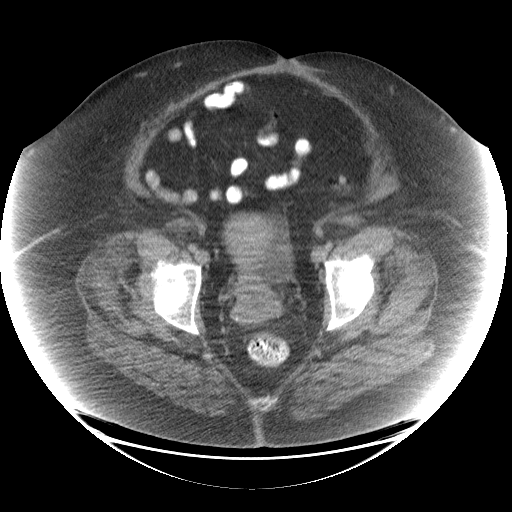
[im 30/106  soft-tissue]
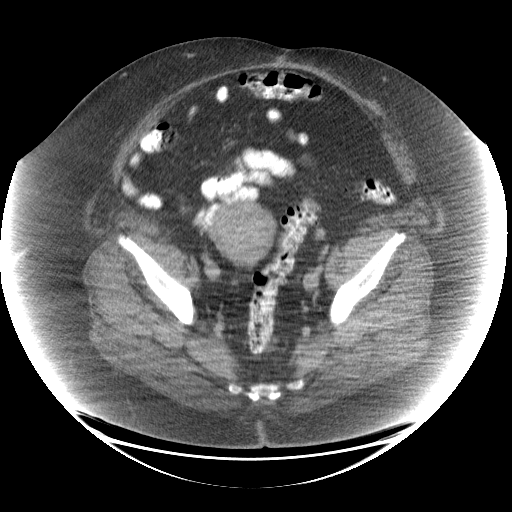
[im 36/106  soft-tissue]
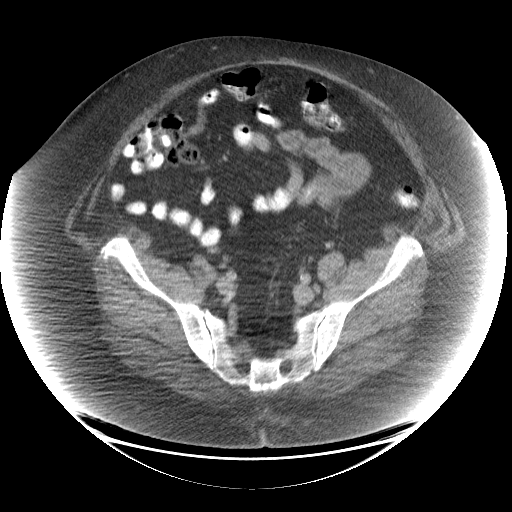
[im 47/106  soft-tissue]
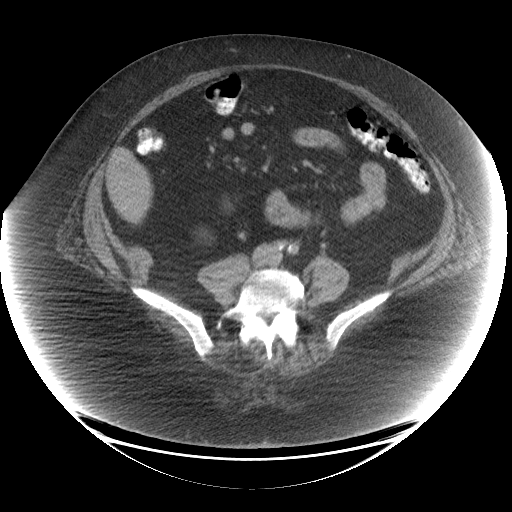
[im 53/106  soft-tissue]
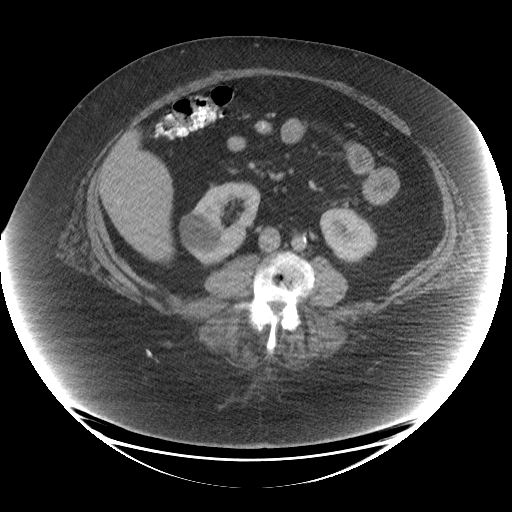
[im 59/106  soft-tissue]
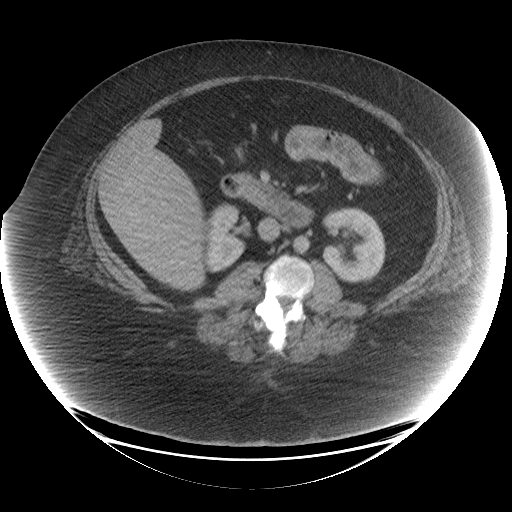
[im 71/106  soft-tissue]
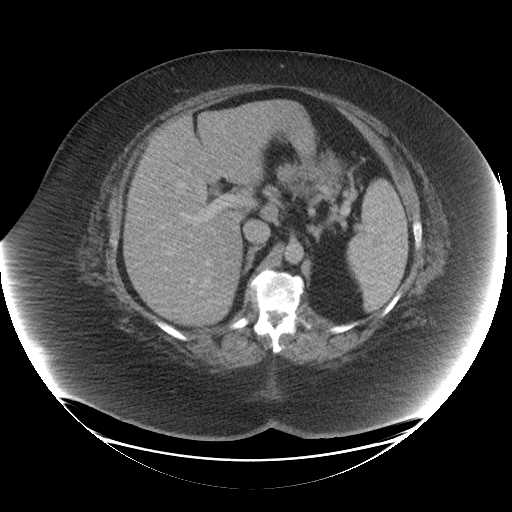
[im 71/106  bone]
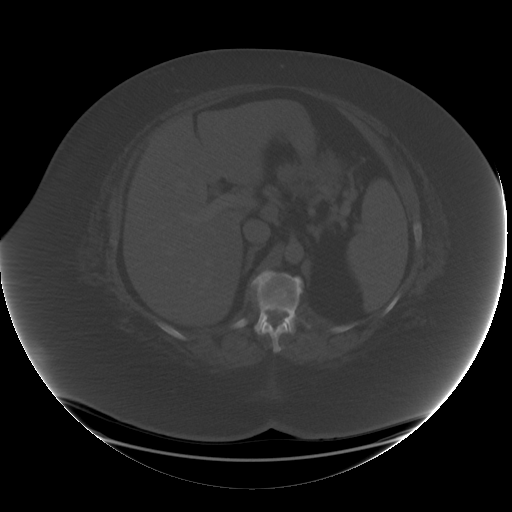
[im 76/106  soft-tissue]
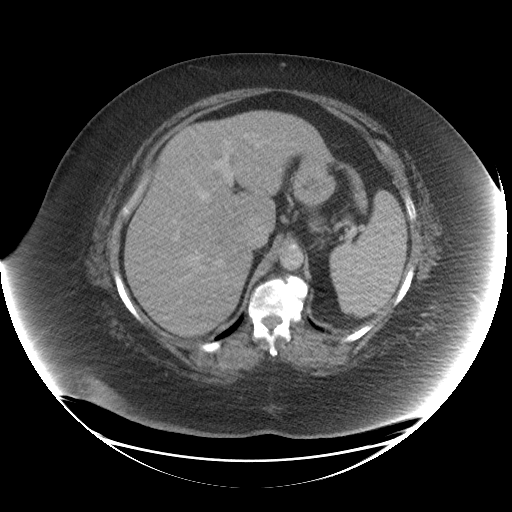
[im 82/106  soft-tissue]
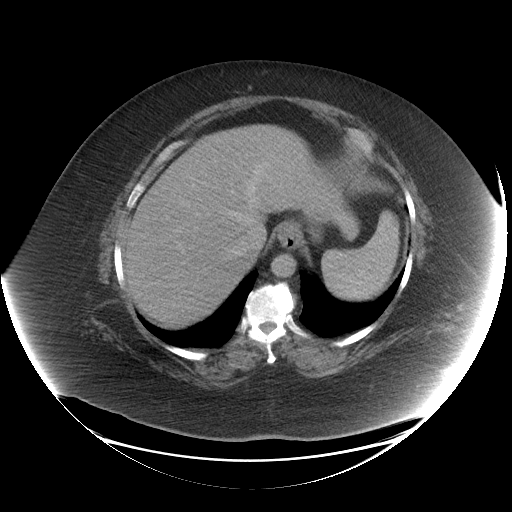
[im 94/106  soft-tissue]
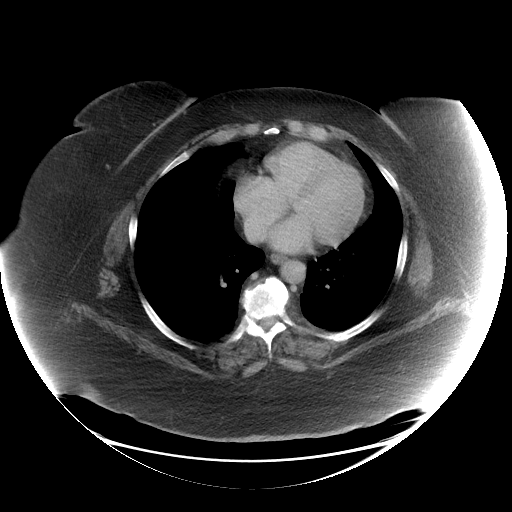
[im 100/106  soft-tissue]
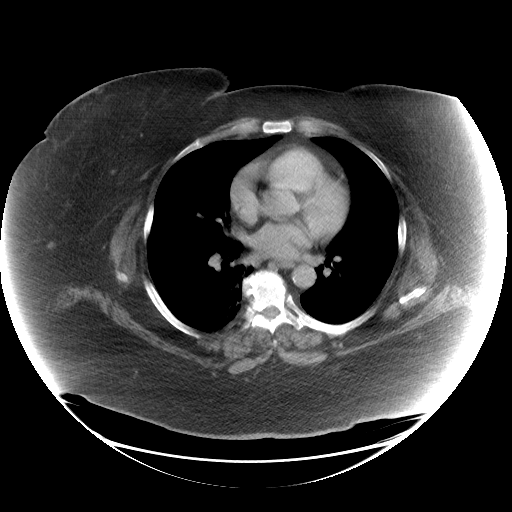

[Series 101: cor · coronal · 1.02mm/px · 3 of 195 slices shown]
[im 65/195  soft-tissue]
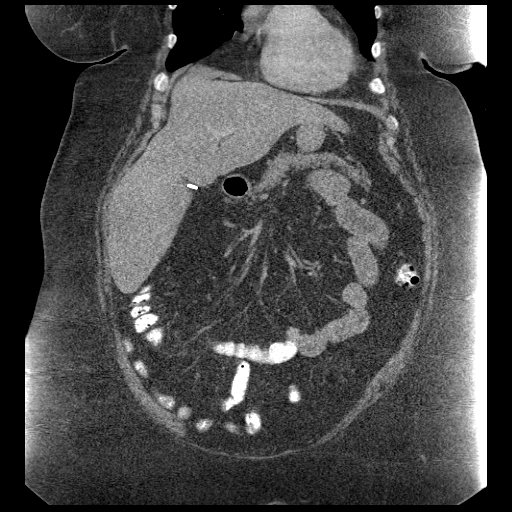
[im 87/195  soft-tissue]
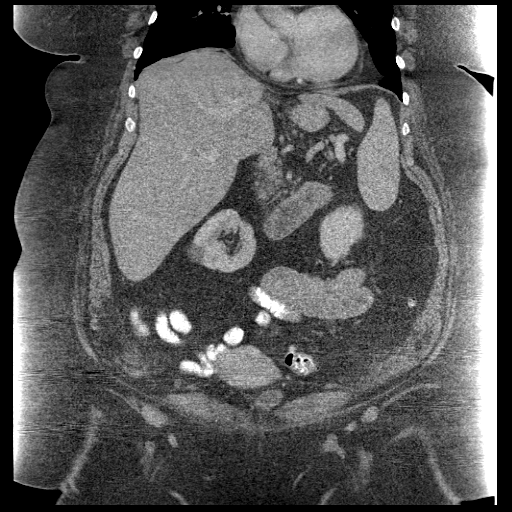
[im 108/195  soft-tissue]
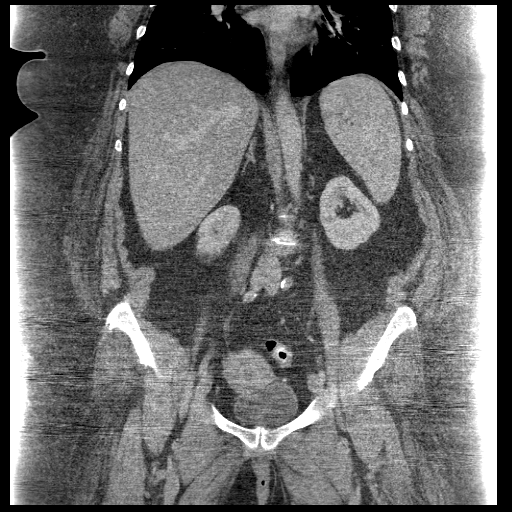

[16 of 46 positions shown; findings below may reference images not displayed]

FINDINGS: Mild atelectasis or scarring at the lung bases is similar to the
prior study. There is no significant pleural or pericardial
effusion.

There is mild chronic extrahepatic biliary dilatation status post
cholecystectomy, improved from the prior study. The liver appears
normal. The spleen has a stable appearance with ill-defined
low-density inferiorly. There is a stable 10 mm splenic artery
aneurysm. The pancreas appears stable.

There is no adrenal mass. A 4.7 cm right renal cyst is unchanged.
The left kidney appears normal. There is no hydronephrosis.

Patient has undergone interval inferior ventral hernia repair. No
recurrent hernia is demonstrated. There is soft tissue thickening of
the anterior abdominal wall musculature around the umbilicus. A
small amount of central low density is best seen on the reformatted
images, just off midline from the umbilicus. There is no contrast
material or air within this low-density collection. There is no
obvious fistulous tract. The intra-abdominal bowel is normal in
caliber and well opacified with enteric contrast. No intra-abdominal
inflammatory changes or fluid collections are seen. There is no
evidence of residual or recurrent bowel obstruction. Mild sigmoid
colon diverticular changes are stable.

There is stable aortoiliac atherosclerosis. The uterus, ovaries and
bladder appear unremarkable.

There are diffuse degenerative changes throughout the thoracolumbar
spine associated with a biconvex scoliosis.
IMPRESSION: 1. Interval ventral hernia repair without evidence of residual or
recurrent hernia.
2. There is soft tissue thickening within the anterior abdominal
wall around the umbilicus with minimal central low-density, but no
drainable abscess or obvious fistulous tract.
3. No acute findings.
4. Similar mild biliary dilatation status post cholecystectomy and
right renal cyst.

## 2014-05-05 DIAGNOSIS — Z23 Encounter for immunization: Secondary | ICD-10-CM | POA: Diagnosis not present

## 2014-05-10 DIAGNOSIS — N183 Chronic kidney disease, stage 3 (moderate): Secondary | ICD-10-CM | POA: Diagnosis not present

## 2014-05-10 DIAGNOSIS — F39 Unspecified mood [affective] disorder: Secondary | ICD-10-CM | POA: Diagnosis not present

## 2014-05-10 DIAGNOSIS — F411 Generalized anxiety disorder: Secondary | ICD-10-CM | POA: Diagnosis not present

## 2014-05-10 DIAGNOSIS — I129 Hypertensive chronic kidney disease with stage 1 through stage 4 chronic kidney disease, or unspecified chronic kidney disease: Secondary | ICD-10-CM | POA: Diagnosis not present

## 2014-05-10 DIAGNOSIS — E1129 Type 2 diabetes mellitus with other diabetic kidney complication: Secondary | ICD-10-CM | POA: Diagnosis not present

## 2014-05-10 DIAGNOSIS — F322 Major depressive disorder, single episode, severe without psychotic features: Secondary | ICD-10-CM | POA: Diagnosis not present

## 2014-05-10 DIAGNOSIS — E780 Pure hypercholesterolemia: Secondary | ICD-10-CM | POA: Diagnosis not present

## 2014-05-10 DIAGNOSIS — M109 Gout, unspecified: Secondary | ICD-10-CM | POA: Diagnosis not present

## 2014-05-22 DIAGNOSIS — H3531 Nonexudative age-related macular degeneration: Secondary | ICD-10-CM | POA: Diagnosis not present

## 2014-05-22 DIAGNOSIS — E119 Type 2 diabetes mellitus without complications: Secondary | ICD-10-CM | POA: Diagnosis not present

## 2014-05-22 DIAGNOSIS — H2513 Age-related nuclear cataract, bilateral: Secondary | ICD-10-CM | POA: Diagnosis not present

## 2014-06-05 DIAGNOSIS — I129 Hypertensive chronic kidney disease with stage 1 through stage 4 chronic kidney disease, or unspecified chronic kidney disease: Secondary | ICD-10-CM | POA: Diagnosis not present

## 2014-08-10 DIAGNOSIS — E1129 Type 2 diabetes mellitus with other diabetic kidney complication: Secondary | ICD-10-CM | POA: Diagnosis not present

## 2014-12-05 DIAGNOSIS — N183 Chronic kidney disease, stage 3 (moderate): Secondary | ICD-10-CM | POA: Diagnosis not present

## 2014-12-05 DIAGNOSIS — M109 Gout, unspecified: Secondary | ICD-10-CM | POA: Diagnosis not present

## 2014-12-05 DIAGNOSIS — I129 Hypertensive chronic kidney disease with stage 1 through stage 4 chronic kidney disease, or unspecified chronic kidney disease: Secondary | ICD-10-CM | POA: Diagnosis not present

## 2014-12-05 DIAGNOSIS — F322 Major depressive disorder, single episode, severe without psychotic features: Secondary | ICD-10-CM | POA: Diagnosis not present

## 2014-12-05 DIAGNOSIS — F411 Generalized anxiety disorder: Secondary | ICD-10-CM | POA: Diagnosis not present

## 2014-12-05 DIAGNOSIS — Z6841 Body Mass Index (BMI) 40.0 and over, adult: Secondary | ICD-10-CM | POA: Diagnosis not present

## 2014-12-05 DIAGNOSIS — Z1389 Encounter for screening for other disorder: Secondary | ICD-10-CM | POA: Diagnosis not present

## 2014-12-05 DIAGNOSIS — E1129 Type 2 diabetes mellitus with other diabetic kidney complication: Secondary | ICD-10-CM | POA: Diagnosis not present

## 2014-12-05 DIAGNOSIS — G479 Sleep disorder, unspecified: Secondary | ICD-10-CM | POA: Diagnosis not present

## 2014-12-05 DIAGNOSIS — E780 Pure hypercholesterolemia: Secondary | ICD-10-CM | POA: Diagnosis not present

## 2014-12-05 DIAGNOSIS — F39 Unspecified mood [affective] disorder: Secondary | ICD-10-CM | POA: Diagnosis not present

## 2015-01-22 DIAGNOSIS — J020 Streptococcal pharyngitis: Secondary | ICD-10-CM | POA: Diagnosis not present

## 2015-05-03 DIAGNOSIS — Z23 Encounter for immunization: Secondary | ICD-10-CM | POA: Diagnosis not present

## 2015-06-10 DIAGNOSIS — F39 Unspecified mood [affective] disorder: Secondary | ICD-10-CM | POA: Diagnosis not present

## 2015-06-10 DIAGNOSIS — Z0000 Encounter for general adult medical examination without abnormal findings: Secondary | ICD-10-CM | POA: Diagnosis not present

## 2015-06-10 DIAGNOSIS — I129 Hypertensive chronic kidney disease with stage 1 through stage 4 chronic kidney disease, or unspecified chronic kidney disease: Secondary | ICD-10-CM | POA: Diagnosis not present

## 2015-06-10 DIAGNOSIS — M109 Gout, unspecified: Secondary | ICD-10-CM | POA: Diagnosis not present

## 2015-06-10 DIAGNOSIS — F411 Generalized anxiety disorder: Secondary | ICD-10-CM | POA: Diagnosis not present

## 2015-06-10 DIAGNOSIS — Z6841 Body Mass Index (BMI) 40.0 and over, adult: Secondary | ICD-10-CM | POA: Diagnosis not present

## 2015-06-10 DIAGNOSIS — N183 Chronic kidney disease, stage 3 (moderate): Secondary | ICD-10-CM | POA: Diagnosis not present

## 2015-06-10 DIAGNOSIS — E1129 Type 2 diabetes mellitus with other diabetic kidney complication: Secondary | ICD-10-CM | POA: Diagnosis not present

## 2015-06-10 DIAGNOSIS — Z23 Encounter for immunization: Secondary | ICD-10-CM | POA: Diagnosis not present

## 2015-06-10 DIAGNOSIS — E7800 Pure hypercholesterolemia, unspecified: Secondary | ICD-10-CM | POA: Diagnosis not present

## 2015-06-10 DIAGNOSIS — F322 Major depressive disorder, single episode, severe without psychotic features: Secondary | ICD-10-CM | POA: Diagnosis not present

## 2016-09-01 ENCOUNTER — Other Ambulatory Visit: Payer: Self-pay | Admitting: Family Medicine

## 2016-09-01 DIAGNOSIS — Z1231 Encounter for screening mammogram for malignant neoplasm of breast: Secondary | ICD-10-CM

## 2016-09-25 ENCOUNTER — Ambulatory Visit
Admission: RE | Admit: 2016-09-25 | Discharge: 2016-09-25 | Disposition: A | Discharge status: Home / Self Care | Payer: Medicare Other | Source: Ambulatory Visit | Attending: Family Medicine | Admitting: Family Medicine

## 2016-09-25 DIAGNOSIS — Z1231 Encounter for screening mammogram for malignant neoplasm of breast: Secondary | ICD-10-CM | POA: Insufficient documentation
